# Patient Record
Sex: Female | Born: 2000 | Race: Black or African American | Hispanic: No | Marital: Single | Smoking: Never smoker
Health system: Southern US, Community
[De-identification: ages and names within clinical notes are randomized; demographics above are authoritative.]

## PROBLEM LIST (undated history)

## (undated) ENCOUNTER — Emergency Department (HOSPITAL_COMMUNITY): Admission: EM | Payer: Medicaid Other

## (undated) DIAGNOSIS — Z8619 Personal history of other infectious and parasitic diseases: Secondary | ICD-10-CM

## (undated) DIAGNOSIS — K819 Cholecystitis, unspecified: Secondary | ICD-10-CM

## (undated) DIAGNOSIS — K6289 Other specified diseases of anus and rectum: Secondary | ICD-10-CM

## (undated) DIAGNOSIS — R079 Chest pain, unspecified: Secondary | ICD-10-CM

## (undated) DIAGNOSIS — K5190 Ulcerative colitis, unspecified, without complications: Secondary | ICD-10-CM

## (undated) DIAGNOSIS — K529 Noninfective gastroenteritis and colitis, unspecified: Secondary | ICD-10-CM

## (undated) HISTORY — DX: Ulcerative colitis, unspecified, without complications: K51.90

## (undated) HISTORY — DX: Cholecystitis, unspecified: K81.9

## (undated) HISTORY — DX: Other specified diseases of anus and rectum: K62.89

## (undated) HISTORY — DX: Personal history of other infectious and parasitic diseases: Z86.19

## (undated) HISTORY — PX: EYE SURGERY: SHX253

## (undated) HISTORY — PX: TONSILECTOMY/ADENOIDECTOMY WITH MYRINGOTOMY: SHX6125

## (undated) HISTORY — DX: Chest pain, unspecified: R07.9

---
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
## 2018-04-20 NOTE — L&D Delivery Note (Signed)
OB/GYN Faculty Practice Delivery Note  Robin Garrison is a 18 y.o. G1P0 s/p NSVD at [redacted]w[redacted]d. She was admitted for spontaneous labor and Cat II tracing.   ROM: 0h 50m with clear fluid GBS Status: --/Positive (09/28 0217) Maximum Maternal Temperature: 99.6 F    Labor Progress: . Patient arrived at 2.5 cm dilation and progressed spontaneously to full dilation without further augmentation.   Delivery Date/Time: 02/07/2019 at 0356 Delivery: Called to room and patient was complete and pushing. Head delivered in LOA position. No nuchal cord present. Shoulder and body delivered in usual fashion. Infant with spontaneous cry, placed on mother's abdomen, dried and stimulated. Cord clamped x 2 after 1-minute delay, and cut by father. Cord blood drawn. Placenta delivered spontaneously with gentle cord traction. Fundus firm with massage and Pitocin. Labia, perineum, vagina, and cervix inspected with complex L labial laceration.   Placenta: 3v, intact Complications: none Lacerations: L labial, repaired with 3-0 Vicryl for deep sutures and 4-0 monocryl for subcuticular. Single pass of Vicryl passed through labial skin, patient made aware and advised to have segment cut in several days if persistently bothersome EBL: 358 cc Analgesia: epidural   Infant: APGAR (1 MIN): 9   APGAR (5 MINS): 9    Weight: 3246 grams  Augustin Coupe, MD/[facility] OB/GYN Fellow, Faculty Practice

## 2018-09-19 ENCOUNTER — Other Ambulatory Visit: Payer: Self-pay

## 2018-09-19 ENCOUNTER — Encounter: Payer: Self-pay | Admitting: Advanced Practice Midwife

## 2018-09-19 ENCOUNTER — Ambulatory Visit (INDEPENDENT_AMBULATORY_CARE_PROVIDER_SITE_OTHER): Payer: Medicaid Other | Admitting: Advanced Practice Midwife

## 2018-09-19 ENCOUNTER — Other Ambulatory Visit (HOSPITAL_COMMUNITY)
Admission: RE | Admit: 2018-09-19 | Discharge: 2018-09-19 | Disposition: A | Discharge status: Home / Self Care | Payer: Medicaid Other | Source: Ambulatory Visit | Attending: Advanced Practice Midwife | Admitting: Advanced Practice Midwife

## 2018-09-19 VITALS — BP 107/69 | HR 86 | Wt 166.0 lb

## 2018-09-19 DIAGNOSIS — Z3482 Encounter for supervision of other normal pregnancy, second trimester: Secondary | ICD-10-CM

## 2018-09-19 DIAGNOSIS — Z3A19 19 weeks gestation of pregnancy: Secondary | ICD-10-CM

## 2018-09-19 DIAGNOSIS — Z3480 Encounter for supervision of other normal pregnancy, unspecified trimester: Secondary | ICD-10-CM | POA: Diagnosis not present

## 2018-09-19 DIAGNOSIS — Z315 Encounter for genetic counseling: Secondary | ICD-10-CM | POA: Diagnosis not present

## 2018-09-19 MED ORDER — BLOOD PRESSURE CUFF MISC: 1.0000 | Freq: Once | 0 refills | Status: AC

## 2018-09-19 NOTE — Patient Instructions (Signed)

## 2018-09-19 NOTE — Progress Notes (Signed)
Pt is here for initial OB visit. LMP 05/03/18 (unsure of exact day) EDD 01/28/19 per patient.

## 2018-09-19 NOTE — Progress Notes (Signed)
Subjective:   Robin Garrison is a 18 y.o. G1P0 at 4249w6d by LMP being seen today for her first obstetrical visit.  Her obstetrical history is significant for none and has Ulcerative colitis without complications (HCC); Excessive gas; Lactose malabsorption; Chest pain on exertion; Dysmenorrhea in adolescent; Vitamin D deficiency; Chronic ulcerative proctitis ([facility]); and Supervision of other normal pregnancy, antepartum on their problem list.. Patient does intend to breast feed. Pregnancy history fully reviewed.  Patient reports no complaints.  Of note, pt reported to RN that [facility] with EDD 10/10, instead of 10/20 per LMP.  Pt indicated that [place] was done in [place], at [facility].  There is no record of an encounter or US during this pregnancy.    HISTORY: OB History  Gravida Para Term Preterm AB Living  1 0 0 0 0 0  SAB TAB Ectopic Multiple Live Births  0 0 0 0 0    # Outcome Date GA Lbr Len/2nd Weight Sex Delivery Anes PTL Lv  1 Current            Past Medical History:  Diagnosis Date  . Chest pain 03/06/2013   Saw [facility] cardiolgy for CP on exertion in cold weather; cleared for sports, no restrictions  . Cholecystitis   . Proctitis   . Proctitis    Past Surgical History:  Procedure Laterality Date  . TONSILECTOMY/ADENOIDECTOMY WITH MYRINGOTOMY     Family History  Problem Relation Age of Onset  . Diabetes Mother   . Diabetes Other   . Diabetes Paternal Aunt   . Vision loss Paternal Grandmother   . Inflammatory bowel disease Neg Hx    Social History   Tobacco Use  . Smoking status: Never Smoker  . Smokeless tobacco: Never Used  Substance Use Topics  . Alcohol use: No    Alcohol/week: 0.0 standard drinks  . Drug use: No   Allergies  Allergen Reactions  . Lactose Intolerance (Gi) Other (See Comments)    Upset stomach   Current Outpatient Medications on File Prior to Visit  Medication Sig Dispense Refill  . azithromycin (ZITHROMAX) 500 MG  tablet 1000 mg by mouth once (Patient not taking: Reported on 03/31/2017) 2 tablet 0  . folic acid (FOLVITE) 1 MG tablet   2  . mercaptopurine (PURINETHOL) 50 MG tablet   0  . naproxen (NAPROSYN) 500 MG tablet Take 1 tablet (500 mg total) by mouth 2 (two) times daily with a meal. (Patient not taking: Reported on 09/19/2018) 60 tablet 1  . norgestrel-ethinyl estradiol (LO/OVRAL,CRYSELLE) 0.3-30 MG-MCG tablet Take 1 tablet by mouth daily. 84 tablet 4  . sulfaSALAzine (AZULFIDINE) 500 MG tablet Take 2 tablets (1,000 mg total) by mouth 2 (two) times daily. (Patient not taking: Reported on 09/19/2018) 120 tablet 0   No current facility-administered medications on file prior to visit.      Exam   Vitals:   09/19/18 1351  BP: 107/69  Pulse: 86  Weight: 166 lb (75.3 kg)      Uterus:     Pelvic Exam: Perineum: no hemorrhoids, normal perineum   Vulva: normal external genitalia, no lesions   Vagina:  normal mucosa, normal discharge   Cervix: no lesions and normal, pap smear done.    Adnexa: normal adnexa and no mass, fullness, tenderness   Bony Pelvis: average  System: General: well-developed, well-nourished female in no acute distress   Breast:  normal appearance, no masses or tenderness  Skin: normal coloration and turgor, no rashes   Neurologic: oriented, normal, negative, normal mood   Extremities: normal strength, tone, and muscle mass, ROM of all joints is normal   HEENT PERRLA, extraocular movement intact and sclera clear, anicteric   Mouth/Teeth mucous membranes moist, pharynx normal without lesions and dental hygiene good   Neck supple and no masses   Cardiovascular: regular rate and rhythm   Respiratory:  no respiratory distress, normal breath sounds   Abdomen: soft, non-tender; bowel sounds normal; no masses,  no organomegaly     Assessment:   Pregnancy: G1P0 Patient Active Problem List   Diagnosis Date Noted  . Supervision of other normal pregnancy, antepartum 09/19/2018   . Vitamin D deficiency 12/28/2014  . 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 Dysmenorrhea in adolescent 10/03/2014  . Chest pain on exertion 03/06/2013  . 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 Excessive gas 11/24/2012  . Ulcerative colitis without complications (HCC)      Plan:   1. Supervision of other normal pregnancy, antepartum --Anticipatory guidance about next visits/weeks of pregnancy given. --Reviewed safety, visitor policy, reassurance about COVID-19 for pregnancy at this time. Discussed possible changes to visits, including televisits, that may occur due to COVID-19.  The office remains open if pt needs to be seen and MAU is open 24 hours/day for OB emergencies.   --Fundal height c/w LMP dating, no record of early [place] reported initially by pt, anatomy US ordered.  - Obstetric Panel, Including HIV - Culture, OB Urine - Genetic Screening - Babyscripts Schedule Optimization - AFP, Serum, Open Spina Bifida - Urine cytology ancillary only(Louise) - [place] MFM OB COMP + 14 WK; Future   Initial labs drawn. Continue prenatal vitamins. Discussed and offered genetic screening options, including Quad screen/AFP, NIPS testing, and option to decline testing. Benefits/risks/alternatives reviewed. Pt aware that anatomy US is form of genetic screening with lower accuracy in detecting trisomies than blood work.  Pt chooses genetic screening today. NIPS: ordered. Ultrasound discussed; fetal anatomic survey: ordered. Problem list reviewed and updated. The nature of [facility] with multiple MDs and other Advanced Practice Providers was explained to patient; also emphasized that residents, students are part of our team. Routine obstetric precautions reviewed. No follow-ups on file.   Sharen Counter, CNM 09/19/18 2:04 PM

## 2018-09-20 LAB — URINE CYTOLOGY ANCILLARY ONLY
Chlamydia: NEGATIVE
Neisseria Gonorrhea: NEGATIVE

## 2018-09-20 NOTE — Progress Notes (Signed)
Subjective: Robin Garrison is a G1P0 at [redacted]w[redacted]d who presents to [facility] today for ob/contraception couseling.  She does not  have a history of any mental health concerns. She is not  currently sexually active. [name]. Patient states immediate and extended family  as her support system.   BP 107/69   Pulse 86   Wt 166 lb (75.3 kg)   LMP 05/03/2018 (LMP Unknown)   BMI 29.41 kg/m   Birth Control History:  Pills   MDM Patient counseled on all options for birth control today including LARC. Patient desires pills initiated for birth control.  Assessment:  18 y.o. female considering pills for birth control  Plan: scheduled wic visit for patient with [facility]   Gwyndolyn Saxon, [place] 09/20/2018 11:51 AM

## 2018-09-21 LAB — AFP, SERUM, OPEN SPINA BIFIDA
AFP MoM: 1.24
AFP Value: 70 ng/mL
Gest. Age on Collection Date: 19.9 weeks
Maternal Age At EDD: 20.3 yr
OSBR Risk 1 IN: 10000
Test Results:: NEGATIVE
Weight: 166 [lb_av]

## 2018-09-21 LAB — OBSTETRIC PANEL, INCLUDING HIV
Antibody Screen: NEGATIVE
Basophils Absolute: 0 10*3/uL (ref 0.0–0.2)
Basos: 1 %
EOS (ABSOLUTE): 0.1 10*3/uL (ref 0.0–0.4)
Eos: 1 %
HIV Screen 4th Generation wRfx: NONREACTIVE
Hematocrit: 32.7 % — ABNORMAL LOW (ref 34.0–46.6)
Hemoglobin: 11.1 g/dL (ref 11.1–15.9)
Hepatitis B Surface Ag: NEGATIVE
Immature Grans (Abs): 0 10*3/uL (ref 0.0–0.1)
Immature Granulocytes: 0 %
Lymphocytes Absolute: 1.2 10*3/uL (ref 0.7–3.1)
Lymphs: 23 %
MCH: 31.8 pg (ref 26.6–33.0)
MCHC: 33.9 g/dL (ref 31.5–35.7)
MCV: 94 fL (ref 79–97)
Monocytes Absolute: 0.4 10*3/uL (ref 0.1–0.9)
Monocytes: 7 %
Neutrophils Absolute: 3.6 10*3/uL (ref 1.4–7.0)
Neutrophils: 68 %
Platelets: 283 10*3/uL (ref 150–450)
RBC: 3.49 x10E6/uL — ABNORMAL LOW (ref 3.77–5.28)
RDW: 11.9 % (ref 11.7–15.4)
RPR Ser Ql: NONREACTIVE
Rh Factor: POSITIVE
Rubella Antibodies, IGG: 1.71 index (ref >0.99)
WBC: 5.3 10*3/uL (ref 3.4–10.8)

## 2018-09-22 ENCOUNTER — Ambulatory Visit (HOSPITAL_COMMUNITY): Payer: Medicaid Other

## 2018-09-22 LAB — URINE CULTURE, OB REFLEX

## 2018-09-22 LAB — CULTURE, OB URINE

## 2018-09-26 ENCOUNTER — Encounter: Payer: Self-pay | Admitting: Advanced Practice Midwife

## 2018-09-27 ENCOUNTER — Other Ambulatory Visit: Payer: Self-pay

## 2018-09-27 ENCOUNTER — Ambulatory Visit (HOSPITAL_COMMUNITY)
Admission: RE | Admit: 2018-09-27 | Discharge: 2018-09-27 | Disposition: A | Discharge status: Home / Self Care | Payer: Medicaid Other | Source: Ambulatory Visit | Attending: Obstetrics and Gynecology | Admitting: Obstetrics and Gynecology

## 2018-09-27 DIAGNOSIS — Z3480 Encounter for supervision of other normal pregnancy, unspecified trimester: Secondary | ICD-10-CM | POA: Insufficient documentation

## 2018-09-27 DIAGNOSIS — Z3A19 19 weeks gestation of pregnancy: Secondary | ICD-10-CM | POA: Diagnosis not present

## 2018-09-27 DIAGNOSIS — Z363 Encounter for antenatal screening for malformations: Secondary | ICD-10-CM | POA: Diagnosis not present

## 2018-09-27 DIAGNOSIS — Z3A21 21 weeks gestation of pregnancy: Secondary | ICD-10-CM | POA: Diagnosis not present

## 2018-09-30 DIAGNOSIS — Z136 Encounter for screening for cardiovascular disorders: Secondary | ICD-10-CM | POA: Diagnosis not present

## 2018-09-30 DIAGNOSIS — Z3482 Encounter for supervision of other normal pregnancy, second trimester: Secondary | ICD-10-CM | POA: Diagnosis not present

## 2018-11-07 ENCOUNTER — Ambulatory Visit (INDEPENDENT_AMBULATORY_CARE_PROVIDER_SITE_OTHER): Payer: Medicaid Other | Admitting: Advanced Practice Midwife

## 2018-11-07 ENCOUNTER — Other Ambulatory Visit: Payer: Medicaid Other

## 2018-11-07 ENCOUNTER — Other Ambulatory Visit: Payer: Self-pay

## 2018-11-07 ENCOUNTER — Encounter: Payer: Self-pay | Admitting: Advanced Practice Midwife

## 2018-11-07 VITALS — BP 113/77 | HR 80 | Temp 98.0°F | Wt 178.0 lb

## 2018-11-07 DIAGNOSIS — Z3A26 26 weeks gestation of pregnancy: Secondary | ICD-10-CM

## 2018-11-07 DIAGNOSIS — Z23 Encounter for immunization: Secondary | ICD-10-CM

## 2018-11-07 DIAGNOSIS — Z3480 Encounter for supervision of other normal pregnancy, unspecified trimester: Secondary | ICD-10-CM | POA: Diagnosis not present

## 2018-11-07 DIAGNOSIS — O26892 Other specified pregnancy related conditions, second trimester: Secondary | ICD-10-CM

## 2018-11-07 DIAGNOSIS — R109 Unspecified abdominal pain: Secondary | ICD-10-CM

## 2018-11-07 NOTE — Progress Notes (Signed)
Pt notes sharp abdominal cramps that are everyday.

## 2018-11-07 NOTE — Patient Instructions (Signed)
Third Trimester of Pregnancy The third trimester is from week 28 through week 40 (months 7 through 9). The third trimester is a time when the unborn baby (fetus) is growing rapidly. At the end of the ninth month, the fetus is about 20 inches in length and weighs 6-10 pounds. Body changes during your third trimester Your body will continue to go through many changes during pregnancy. The changes vary from woman to woman. During the third trimester:  Your weight will continue to increase. You can expect to gain 25-35 pounds (11-16 kg) by the end of the pregnancy.  You may begin to get stretch marks on your hips, abdomen, and breasts.  You may urinate more often because the fetus is moving lower into your pelvis and pressing on your bladder.  You may develop or continue to have heartburn. This is caused by increased hormones that slow down muscles in the digestive tract.  You may develop or continue to have constipation because increased hormones slow digestion and cause the muscles that push waste through your intestines to relax.  You may develop hemorrhoids. These are swollen veins (varicose veins) in the rectum that can itch or be painful.  You may develop swollen, bulging veins (varicose veins) in your legs.  You may have increased body aches in the pelvis, back, or thighs. This is due to weight gain and increased hormones that are relaxing your joints.  You may have changes in your hair. These can include thickening of your hair, rapid growth, and changes in texture. Some women also have hair loss during or after pregnancy, or hair that feels dry or thin. Your hair will most likely return to normal after your baby is born.  Your breasts will continue to grow and they will continue to become tender. A yellow fluid (colostrum) may leak from your breasts. This is the first milk you are producing for your baby.  Your belly button may stick out.  You may notice more swelling in your hands,  face, or ankles.  You may have increased tingling or numbness in your hands, arms, and legs. The skin on your belly may also feel numb.  You may feel short of breath because of your expanding uterus.  You may have more problems sleeping. This can be caused by the size of your belly, increased need to urinate, and an increase in your body's metabolism.  You may notice the fetus "dropping," or moving lower in your abdomen (lightening).  You may have increased vaginal discharge.  You may notice your joints feel loose and you may have pain around your pelvic bone. What to expect at prenatal visits You will have prenatal exams every 2 weeks until week 36. Then you will have weekly prenatal exams. During a routine prenatal visit:  You will be weighed to make sure you and the baby are growing normally.  Your blood pressure will be taken.  Your abdomen will be measured to track your baby's growth.  The fetal heartbeat will be listened to.  Any test results from the previous visit will be discussed.  You may have a cervical check near your due date to see if your cervix has softened or thinned (effaced).  You will be tested for Group B streptococcus. This happens between 35 and 37 weeks. Your health care provider may ask you:  What your birth plan is.  How you are feeling.  If you are feeling the baby move.  If you have had any abnormal   symptoms, such as leaking fluid, bleeding, severe headaches, or abdominal cramping.  If you are using any tobacco products, including cigarettes, chewing tobacco, and electronic cigarettes.  If you have any questions. Other tests or screenings that may be performed during your third trimester include:  Blood tests that check for low iron levels (anemia).  Fetal testing to check the health, activity level, and growth of the fetus. Testing is done if you have certain medical conditions or if there are problems during the pregnancy.  Nonstress test  (NST). This test checks the health of your baby to make sure there are no signs of problems, such as the baby not getting enough oxygen. During this test, a belt is placed around your belly. The baby is made to move, and its heart rate is monitored during movement. What is false labor? False labor is a condition in which you feel small, irregular tightenings of the muscles in the womb (contractions) that usually go away with rest, changing position, or drinking water. These are called Braxton Hicks contractions. Contractions may last for hours, days, or even weeks before true labor sets in. If contractions come at regular intervals, become more frequent, increase in intensity, or become painful, you should see your health care provider. What are the signs of labor?  Abdominal cramps.  Regular contractions that start at 10 minutes apart and become stronger and more frequent with time.  Contractions that start on the top of the uterus and spread down to the lower abdomen and back.  Increased pelvic pressure and dull back pain.  A watery or bloody mucus discharge that comes from the vagina.  Leaking of amniotic fluid. This is also known as your "water breaking." It could be a slow trickle or a gush. Let your health care provider know if it has a color or strange odor. If you have any of these signs, call your health care provider right away, even if it is before your due date. Follow these instructions at home: Medicines  Follow your health care provider's instructions regarding medicine use. Specific medicines may be either safe or unsafe to take during pregnancy.  Take a prenatal vitamin that contains at least 600 micrograms (mcg) of folic acid.  If you develop constipation, try taking a stool softener if your health care provider approves. Eating and drinking   Eat a balanced diet that includes fresh fruits and vegetables, whole grains, good sources of protein such as meat, eggs, or tofu,  and low-fat dairy. Your health care provider will help you determine the amount of weight gain that is right for you.  Avoid raw meat and uncooked cheese. These carry germs that can cause birth defects in the baby.  If you have low calcium intake from food, talk to your health care provider about whether you should take a daily calcium supplement.  Eat four or five small meals rather than three large meals a day.  Limit foods that are high in fat and processed sugars, such as fried and sweet foods.  To prevent constipation: ? Drink enough fluid to keep your urine clear or pale yellow. ? Eat foods that are high in fiber, such as fresh fruits and vegetables, whole grains, and beans. Activity  Exercise only as directed by your health care provider. Most women can continue their usual exercise routine during pregnancy. Try to exercise for 30 minutes at least 5 days a week. Stop exercising if you experience uterine contractions.  Avoid heavy lifting.  Do   not exercise in extreme heat or humidity, or at high altitudes.  Wear low-heel, comfortable shoes.  Practice good posture.  You may continue to have sex unless your health care provider tells you otherwise. Relieving pain and discomfort  Take frequent breaks and rest with your legs elevated if you have leg cramps or low back pain.  Take warm sitz baths to soothe any pain or discomfort caused by hemorrhoids. Use hemorrhoid cream if your health care provider approves.  Wear a good support bra to prevent discomfort from breast tenderness.  If you develop varicose veins: ? Wear support pantyhose or compression stockings as told by your healthcare provider. ? Elevate your feet for 15 minutes, 3-4 times a day. Prenatal care  Write down your questions. Take them to your prenatal visits.  Keep all your prenatal visits as told by your health care provider. This is important. Safety  Wear your seat belt at all times when driving.  Make  a list of emergency phone numbers, including numbers for family, friends, the hospital, and police and fire departments. General instructions  Avoid cat litter boxes and soil used by cats. These carry germs that can cause birth defects in the baby. If you have a cat, ask someone to clean the litter box for you.  Do not travel far distances unless it is absolutely necessary and only with the approval of your health care provider.  Do not use hot tubs, steam rooms, or saunas.  Do not drink alcohol.  Do not use any products that contain nicotine or tobacco, such as cigarettes and e-cigarettes. If you need help quitting, ask your health care provider.  Do not use any medicinal herbs or unprescribed drugs. These chemicals affect the formation and growth of the baby.  Do not douche or use tampons or scented sanitary pads.  Do not cross your legs for long periods of time.  To prepare for the arrival of your baby: ? Take prenatal classes to understand, practice, and ask questions about labor and delivery. ? Make a trial run to the hospital. ? Visit the hospital and tour the maternity area. ? Arrange for maternity or paternity leave through employers. ? Arrange for family and friends to take care of pets while you are in the hospital. ? Purchase a rear-facing car seat and make sure you know how to install it in your car. ? Pack your hospital bag. ? Prepare the baby's nursery. Make sure to remove all pillows and stuffed animals from the baby's crib to prevent suffocation.  Visit your dentist if you have not gone during your pregnancy. Use a soft toothbrush to brush your teeth and be gentle when you floss. Contact a health care provider if:  You are unsure if you are in labor or if your water has broken.  You become dizzy.  You have mild pelvic cramps, pelvic pressure, or nagging pain in your abdominal area.  You have lower back pain.  You have persistent nausea, vomiting, or diarrhea.   You have an unusual or bad smelling vaginal discharge.  You have pain when you urinate. Get help right away if:  Your water breaks before 37 weeks.  You have regular contractions less than 5 minutes apart before 37 weeks.  You have a fever.  You are leaking fluid from your vagina.  You have spotting or bleeding from your vagina.  You have severe abdominal pain or cramping.  You have rapid weight loss or weight gain.  You have   shortness of breath with chest pain.  You notice sudden or extreme swelling of your face, hands, ankles, feet, or legs.  Your baby makes fewer than 10 movements in 2 hours.  You have severe headaches that do not go away when you take medicine.  You have vision changes. Summary  The third trimester is from week 28 through week 40, months 7 through 9. The third trimester is a time when the unborn baby (fetus) is growing rapidly.  During the third trimester, your discomfort may increase as you and your baby continue to gain weight. You may have abdominal, leg, and back pain, sleeping problems, and an increased need to urinate.  During the third trimester your breasts will keep growing and they will continue to become tender. A yellow fluid (colostrum) may leak from your breasts. This is the first milk you are producing for your baby.  False labor is a condition in which you feel small, irregular tightenings of the muscles in the womb (contractions) that eventually go away. These are called Braxton Hicks contractions. Contractions may last for hours, days, or even weeks before true labor sets in.  Signs of labor can include: abdominal cramps; regular contractions that start at 10 minutes apart and become stronger and more frequent with time; watery or bloody mucus discharge that comes from the vagina; increased pelvic pressure and dull back pain; and leaking of amniotic fluid. This information is not intended to replace advice given to you by your health  care provider. Make sure you discuss any questions you have with your health care provider. Document Released: 03/31/2001 Document Revised: 07/28/2018 Document Reviewed: 05/12/2016 Elsevier Patient Education  2020 [employer]  

## 2018-11-07 NOTE — Progress Notes (Signed)
   PRENATAL VISIT NOTE  Subjective:  Robin Garrison is a 18 y.o. G1P0 at [redacted]w[redacted]d being seen today for ongoing prenatal care.  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  Patient reports occasional contractions.  Contractions: Not present. Vag. Bleeding: None.  Movement: Present. Denies leaking of fluid.   The following portions of the patient's history were reviewed and updated as appropriate: allergies, current medications, past family history, past medical history, past social history, past surgical history and problem list.   Objective:   Vitals:   11/07/18 0931  BP: 113/77  Pulse: 80  Temp: 98 F (36.7 C)  Weight: 178 lb (80.7 kg)    Fetal Status:     Movement: Present     General:  Alert, oriented and cooperative. Patient is in no acute distress.  Skin: Skin is warm and dry. No rash noted.   Cardiovascular: Normal heart rate noted  Respiratory: Normal respiratory effort, no problems with respiration noted  Abdomen: Soft, gravid, appropriate for gestational age.  Pain/Pressure: Present     Pelvic: Cervical exam deferred        Extremities: Normal range of motion.  Edema: None  Mental Status: Normal mood and affect. Normal behavior. Normal judgment and thought content.   Assessment and Plan:  Pregnancy: G1P0 at [redacted]w[redacted]d   1. Supervision of other normal pregnancy, antepartum --Anticipatory guidance about next visits/weeks of pregnancy given.  --Reviewed safety, visitor policy, and COVID testing for scheduled IOL or and C/S and for active labor admission.  Reassurance about COVID-19 with young healthy women according to current data. Discussed possible changes to  visits, including televisits, that may occur due to COVID-19.  The office remains open if pt needs to be seen and MAU is open 24 hours/day for OB emergencies.  --Next visit in 4 weeks, virtual visit.  - Glucose Tolerance, 2 Hours w/1 Hour - CBC - RPR - HIV Antibody (routine testing w rflx)  2. Abdominal pain during pregnancy in second trimester --Irregular and infrequent, occurs at rest and when walking approximately 2-3 times daily.  Likely braxton-hicks contractions. Increase fluid intake, rest when needed.  Reviewed s/sx of PTL/reasons to seek care.  Preterm labor symptoms and general obstetric precautions including but not limited to vaginal bleeding, contractions, leaking of fluid and fetal movement were reviewed in detail with the patient. Please refer to After Visit Summary for other counseling recommendations.   Return in about 4 weeks (around 12/05/2018).  No future appointments.  Fatima Blank, CNM

## 2018-11-07 NOTE — Addendum Note (Signed)
Addended by: Courtney Heys on: 11/07/2018 10:28 AM   Modules accepted: Orders

## 2018-11-08 LAB — CBC
Hematocrit: 35.9 % (ref 34.0–46.6)
Hemoglobin: 11.7 g/dL (ref 11.1–15.9)
MCH: 30.5 pg (ref 26.6–33.0)
MCHC: 32.6 g/dL (ref 31.5–35.7)
MCV: 94 fL (ref 79–97)
Platelets: 259 x10E3/uL (ref 150–450)
RBC: 3.84 x10E6/uL (ref 3.77–5.28)
RDW: 12.4 % (ref 11.7–15.4)
WBC: 4.9 x10E3/uL (ref 3.4–10.8)

## 2018-11-08 LAB — GLUCOSE TOLERANCE, 2 HOURS W/ 1HR
Glucose, 1 hour: 103 mg/dL (ref 65–179)
Glucose, 2 hour: 96 mg/dL (ref 65–152)
Glucose, Fasting: 76 mg/dL (ref 65–91)

## 2018-11-08 LAB — RPR: RPR Ser Ql: NONREACTIVE

## 2018-11-08 LAB — HIV ANTIBODY (ROUTINE TESTING W REFLEX): HIV Screen 4th Generation wRfx: NONREACTIVE

## 2018-12-05 ENCOUNTER — Ambulatory Visit (INDEPENDENT_AMBULATORY_CARE_PROVIDER_SITE_OTHER): Payer: Medicaid Other | Admitting: Obstetrics

## 2018-12-05 ENCOUNTER — Encounter: Payer: Self-pay | Admitting: Obstetrics

## 2018-12-05 ENCOUNTER — Encounter: Payer: Medicaid Other | Admitting: Obstetrics

## 2018-12-05 VITALS — BP 117/78 | HR 99

## 2018-12-05 DIAGNOSIS — M549 Dorsalgia, unspecified: Secondary | ICD-10-CM | POA: Diagnosis not present

## 2018-12-05 DIAGNOSIS — Z3A30 30 weeks gestation of pregnancy: Secondary | ICD-10-CM

## 2018-12-05 DIAGNOSIS — O26893 Other specified pregnancy related conditions, third trimester: Secondary | ICD-10-CM

## 2018-12-05 DIAGNOSIS — K5120 Ulcerative (chronic) proctitis without complications: Secondary | ICD-10-CM

## 2018-12-05 DIAGNOSIS — Z3480 Encounter for supervision of other normal pregnancy, unspecified trimester: Secondary | ICD-10-CM

## 2018-12-05 MED ORDER — COMFORT FIT MATERNITY SUPP SM MISC: 0 refills | Status: DC

## 2018-12-05 NOTE — Addendum Note (Signed)
Addended by: Shelly Bombard on: 12/05/2018 04:18 PM   Modules accepted: Orders

## 2018-12-05 NOTE — Progress Notes (Addendum)
   TELEHEALTH VIRTUAL OBSTETRICS VISIT ENCOUNTER NOTE  I connected with Robin I Blinder on 12/05/18 at  3:15 PM EDT by telephone at home and verified that I am speaking with the correct person using two identifiers.   I discussed the limitations, risks, security and privacy concerns of performing an evaluation and management service by telephone and the availability of in person appointments. I also discussed with the patient that there may be a patient responsible charge related to this service. The patient expressed understanding and agreed to proceed.  Subjective:  Robin Garrison is a 18 y.o. G1P0 at 89w6dbeing followed for ongoing prenatal care.  She is currently monitored for the following issues for this low-risk pregnancy and has Ulcerative colitis without complications ([place]; Excessive gas; Lactose malabsorption; Chest pain on exertion; Dysmenorrhea in adolescent; Vitamin D deficiency; [place] ulcerative [place]ctitis ([place]; and Supervision of other normal pregnancy, antepartum on their problem list.  Patient reports backache. Reports fetal movement. Denies any contractions, bleeding or leaking of fluid.   The following portions of the patient's history were reviewed and updated as appropriate: allergies, current medications, past family history, past medical history, past social history, past surgical history and problem list.   Objective:   General:  Alert, oriented and cooperative.   Mental Status: Normal mood and affect perceived. Normal judgment and thought content.  Rest of physical exam deferred due to type of encounter  Assessment and Plan:  Pregnancy: G1P0 at 39w6d1. Supervision of other normal pregnancy, antepartum  2. [place] without complication [id number]. Backache symptom Rx: - Elastic Bandages & Supports (COMFORT FIT MATERNITY SUPP SM) MISC; Wear as directed.  Dispense: 1 each; Refill: 0  Preterm labor symptoms and  general obstetric precautions including but not limited to vaginal bleeding, contractions, leaking of fluid and fetal movement were reviewed in detail with the patient.  I discussed the assessment and treatment plan with the patient. The patient was provided an opportunity to ask questions and all were answered. The patient agreed with the plan and demonstrated an understanding of the instructions. The patient was advised to call back or seek an in-person office evaluation/[facility] any urgent or concerning symptoms. Please refer to After Visit Summary for other counseling recommendations.   I provided 10 minutes of non-face-to-face time during this encounter.  Return in about 2 weeks (around 12/19/2018) for MyChart.   ChBaltazar NajjarMD [facility] 12/05/2018

## 2018-12-08 ENCOUNTER — Other Ambulatory Visit: Payer: Self-pay

## 2018-12-08 ENCOUNTER — Encounter (HOSPITAL_COMMUNITY): Payer: Self-pay | Admitting: *Deleted

## 2018-12-08 ENCOUNTER — Inpatient Hospital Stay (HOSPITAL_COMMUNITY)
Admission: AD | Admit: 2018-12-08 | Discharge: 2018-12-08 | Disposition: A | Discharge status: Home / Self Care | Payer: Medicaid Other | Attending: Obstetrics and Gynecology | Admitting: Obstetrics and Gynecology

## 2018-12-08 DIAGNOSIS — M545 Low back pain: Secondary | ICD-10-CM | POA: Insufficient documentation

## 2018-12-08 DIAGNOSIS — Z3480 Encounter for supervision of other normal pregnancy, unspecified trimester: Secondary | ICD-10-CM

## 2018-12-08 DIAGNOSIS — R109 Unspecified abdominal pain: Secondary | ICD-10-CM | POA: Insufficient documentation

## 2018-12-08 DIAGNOSIS — O368130 Decreased fetal movements, third trimester, not applicable or unspecified: Secondary | ICD-10-CM | POA: Diagnosis not present

## 2018-12-08 DIAGNOSIS — Z3689 Encounter for other specified antenatal screening: Secondary | ICD-10-CM | POA: Diagnosis not present

## 2018-12-08 DIAGNOSIS — Z3A31 31 weeks gestation of pregnancy: Secondary | ICD-10-CM | POA: Diagnosis not present

## 2018-12-08 DIAGNOSIS — O26893 Other specified pregnancy related conditions, third trimester: Secondary | ICD-10-CM | POA: Diagnosis not present

## 2018-12-08 DIAGNOSIS — O339 Maternal care for disproportion, unspecified: Secondary | ICD-10-CM | POA: Diagnosis not present

## 2018-12-08 HISTORY — DX: Noninfective gastroenteritis and colitis, unspecified: K52.9

## 2018-12-08 LAB — URINALYSIS, ROUTINE W REFLEX MICROSCOPIC
Bilirubin Urine: NEGATIVE
Glucose, UA: NEGATIVE mg/dL
Hgb urine dipstick: NEGATIVE
Ketones, ur: NEGATIVE mg/dL
Leukocytes,Ua: NEGATIVE
Nitrite: NEGATIVE
Protein, ur: NEGATIVE mg/dL
Specific Gravity, Urine: 1.014 (ref 1.005–1.030)
pH: 7 (ref 5.0–8.0)

## 2018-12-08 NOTE — MAU Provider Note (Signed)
History     CSN: [id number]  Arrival date and time: 12/08/18 1057   First Provider Initiated Contact with Patient 12/08/18 1200      Chief Complaint  Patient presents with  . Decreased Fetal Movement   Robin Garrison is a 18 y.o. G1P0 at [redacted]w[redacted]d who receives care at [facility].  She presents today for Decreased Fetal Movement.  She states she last felt movement yesterday night.  She reports that she usually feels movement throughout the night.  She states she had a sausage, egg, and cheese biscuit this morning around 10am and has still not detected movement.  Patient denies contractions and vaginal concerns including discharge, leaking, or bleeding.      OB History    Gravida  1   Para      Term      Preterm      AB      Living        SAB      TAB      Ectopic      Multiple      Live Births              Past Medical History:  Diagnosis Date  . Chest pain 03/06/2013   Saw [facility] cardiolgy for CP on exertion in cold weather; cleared for sports, no restrictions  . Cholecystitis   . Colitis   . Proctitis   . Proctitis     Past Surgical History:  Procedure Laterality Date  . TONSILECTOMY/ADENOIDECTOMY WITH MYRINGOTOMY      Family History  Problem Relation Age of Onset  . Hypertension Mother   . Healthy Father   . Diabetes Other   . Diabetes Paternal Aunt   . Vision loss Paternal Grandmother   . Inflammatory bowel disease Neg Hx     Social History   Tobacco Use  . Smoking status: Never Smoker  . Smokeless tobacco: Never Used  Substance Use Topics  . Alcohol use: No    Alcohol/week: 0.0 standard drinks  . Drug use: No    Allergies:  Allergies  Allergen Reactions  . Lactose Intolerance ([place]) Other (See Comments)    Upset stomach    Medications Prior to Admission  Medication Sig Dispense Refill Last Dose  . Elastic Bandages & Supports (COMFORT FIT MATERNITY SUPP SM) MISC Wear as directed. 1 each 0   . folic acid (FOLVITE) 1 MG tablet    2   . mercaptopurine (PURINETHOL) 50 MG tablet   0   . naproxen (NAPROSYN) 500 MG tablet Take 1 tablet (500 mg total) by mouth 2 (two) times daily with a meal. (Patient not taking: Reported on 09/19/2018) 60 tablet 1   . Prenatal Vit-Fe Fumarate-FA (PRENATAL MULTIVITAMIN) TABS tablet Take 1 tablet by mouth daily at 12 noon.     . sulfaSALAzine (AZULFIDINE) 500 MG tablet Take 2 tablets (1,000 mg total) by mouth 2 (two) times daily. (Patient not taking: Reported on 09/19/2018) 120 tablet 0     Review of Systems  Constitutional: Negative for chills and fever.  Respiratory: Negative for cough and shortness of breath.   Gastrointestinal: Negative for constipation, diarrhea, nausea and vomiting.  Genitourinary: Negative for dyspareunia, dysuria, vaginal bleeding and vaginal discharge.  Neurological: Negative for dizziness, light-headedness and headaches.   Physical Exam   Blood pressure 108/68, pulse (!) 112, temperature 98.8 F (37.1 C), temperature source Oral, resp. rate 16, last menstrual period 05/03/2018, SpO2 98 %.  Physical Exam  Constitutional: She is oriented to person, place, and time. She appears well-developed and well-nourished. No distress.  HENT:  Head: Normocephalic and atraumatic.  Eyes: Conjunctivae are normal.  Neck: Normal range of motion.  Cardiovascular: Normal rate.  Respiratory: Effort normal.  GI: Soft. There is no abdominal tenderness.  Musculoskeletal: Normal range of motion.  Neurological: She is alert and oriented to person, place, and time.  Skin: Skin is warm and dry.  Psychiatric: She has a normal mood and affect. Her behavior is normal.    Fetal Assessment 145 bpm, Mod Var, -Decels, +Accels Toco: None graphed  MAU Course   Results for orders placed or performed during the hospital encounter of 12/08/18 (from the past 24 hour(s))  Urinalysis, Routine w reflex microscopic     Status: Abnormal   Collection Time: 12/08/18 11:26 AM  Result Value Ref Range    Color, Urine YELLOW YELLOW   APPearance HAZY (A) CLEAR   Specific Gravity, Urine 1.014 1.005 - 1.030   pH 7.0 5.0 - 8.0   Glucose, UA NEGATIVE NEGATIVE mg/dL   Hgb urine dipstick NEGATIVE NEGATIVE   Bilirubin Urine NEGATIVE NEGATIVE   Ketones, ur NEGATIVE NEGATIVE mg/dL   Protein, ur NEGATIVE NEGATIVE mg/dL   Nitrite NEGATIVE NEGATIVE   Leukocytes,Ua NEGATIVE NEGATIVE   No results found.  MDM PE Labs:UA EFM  Assessment and Plan  18 year old G1P0  SIUP at 31.2 weeks Cat I FT DFM  -Exam findings discussed. -Informed of reactive NST. -Patient placed on left side and will give juice to illicit fetal movement. -TV turned off and phone given to mother.  -Patient encouraged to detect fetal movement.  -Will return to reassess.   Cherre RobinsJessica L Pepper Kerrick MSN, CNM 12/08/2018, 12:00 PM   Reassessment (1:13 PM) +Fetal Movement   -Patient reports perception of 12 movements. -Encouraged to try these techniques at home and contact office or report to MAU if unsuccessful. -Patient verbalized understanding and has no questions or concerns. -Discharged to home in stable condition.  Cherre RobinsJessica L Kohei Antonellis MSN, CNM 12/08/2018 1:15 PM

## 2018-12-08 NOTE — Discharge Instructions (Signed)
Third Trimester of Pregnancy The third trimester is from week 28 through week 40 (months 7 through 9). The third trimester is a time when the unborn baby (fetus) is growing rapidly. At the end of the ninth month, the fetus is about 20 inches in length and weighs 6-10 pounds. Body changes during your third trimester Your body will continue to go through many changes during pregnancy. The changes vary from woman to woman. During the third trimester:  Your weight will continue to increase. You can expect to gain 25-35 pounds (11-16 kg) by the end of the pregnancy.  You may begin to get stretch marks on your hips, abdomen, and breasts.  You may urinate more often because the fetus is moving lower into your pelvis and pressing on your bladder.  You may develop or continue to have heartburn. This is caused by increased hormones that slow down muscles in the digestive tract.  You may develop or continue to have constipation because increased hormones slow digestion and cause the muscles that push waste through your intestines to relax.  You may develop hemorrhoids. These are swollen veins (varicose veins) in the rectum that can itch or be painful.  You may develop swollen, bulging veins (varicose veins) in your legs.  You may have increased body aches in the pelvis, back, or thighs. This is due to weight gain and increased hormones that are relaxing your joints.  You may have changes in your hair. These can include thickening of your hair, rapid growth, and changes in texture. Some women also have hair loss during or after pregnancy, or hair that feels dry or thin. Your hair will most likely return to normal after your baby is born.  Your breasts will continue to grow and they will continue to become tender. A yellow fluid (colostrum) may leak from your breasts. This is the first milk you are producing for your baby.  Your belly button may stick out.  You may notice more swelling in your hands,  face, or ankles.  You may have increased tingling or numbness in your hands, arms, and legs. The skin on your belly may also feel numb.  You may feel short of breath because of your expanding uterus.  You may have more problems sleeping. This can be caused by the size of your belly, increased need to urinate, and an increase in your body's metabolism.  You may notice the fetus "dropping," or moving lower in your abdomen (lightening).  You may have increased vaginal discharge.  You may notice your joints feel loose and you may have pain around your pelvic bone. What to expect at prenatal visits You will have prenatal exams every 2 weeks until week 36. Then you will have weekly prenatal exams. During a routine prenatal visit:  You will be weighed to make sure you and the baby are growing normally.  Your blood pressure will be taken.  Your abdomen will be measured to track your baby's growth.  The fetal heartbeat will be listened to.  Any test results from the previous visit will be discussed.  You may have a cervical check near your due date to see if your cervix has softened or thinned (effaced).  You will be tested for Group B streptococcus. This happens between 35 and 37 weeks. Your health care provider may ask you:  What your birth plan is.  How you are feeling.  If you are feeling the baby move.  If you have had any abnormal   symptoms, such as leaking fluid, bleeding, severe headaches, or abdominal cramping.  If you are using any tobacco products, including cigarettes, chewing tobacco, and electronic cigarettes.  If you have any questions. Other tests or screenings that may be performed during your third trimester include:  Blood tests that check for low iron levels (anemia).  Fetal testing to check the health, activity level, and growth of the fetus. Testing is done if you have certain medical conditions or if there are problems during the pregnancy.  Nonstress test  (NST). This test checks the health of your baby to make sure there are no signs of problems, such as the baby not getting enough oxygen. During this test, a belt is placed around your belly. The baby is made to move, and its heart rate is monitored during movement. What is false labor? False labor is a condition in which you feel small, irregular tightenings of the muscles in the womb (contractions) that usually go away with rest, changing position, or drinking water. These are called Braxton Hicks contractions. Contractions may last for hours, days, or even weeks before true labor sets in. If contractions come at regular intervals, become more frequent, increase in intensity, or become painful, you should see your health care provider. What are the signs of labor?  Abdominal cramps.  Regular contractions that start at 10 minutes apart and become stronger and more frequent with time.  Contractions that start on the top of the uterus and spread down to the lower abdomen and back.  Increased pelvic pressure and dull back pain.  A watery or bloody mucus discharge that comes from the vagina.  Leaking of amniotic fluid. This is also known as your "water breaking." It could be a slow trickle or a gush. Let your health care provider know if it has a color or strange odor. If you have any of these signs, call your health care provider right away, even if it is before your due date. Follow these instructions at home: Medicines  Follow your health care provider's instructions regarding medicine use. Specific medicines may be either safe or unsafe to take during pregnancy.  Take a prenatal vitamin that contains at least 600 micrograms (mcg) of folic acid.  If you develop constipation, try taking a stool softener if your health care provider approves. Eating and drinking   Eat a balanced diet that includes fresh fruits and vegetables, whole grains, good sources of protein such as meat, eggs, or tofu,  and low-fat dairy. Your health care provider will help you determine the amount of weight gain that is right for you.  Avoid raw meat and uncooked cheese. These carry germs that can cause birth defects in the baby.  If you have low calcium intake from food, talk to your health care provider about whether you should take a daily calcium supplement.  Eat four or five small meals rather than three large meals a day.  Limit foods that are high in fat and processed sugars, such as fried and sweet foods.  To prevent constipation: ? Drink enough fluid to keep your urine clear or pale yellow. ? Eat foods that are high in fiber, such as fresh fruits and vegetables, whole grains, and beans. Activity  Exercise only as directed by your health care provider. Most women can continue their usual exercise routine during pregnancy. Try to exercise for 30 minutes at least 5 days a week. Stop exercising if you experience uterine contractions.  Avoid heavy lifting.  Do   not exercise in extreme heat or humidity, or at high altitudes.  Wear low-heel, comfortable shoes.  Practice good posture.  You may continue to have sex unless your health care provider tells you otherwise. Relieving pain and discomfort  Take frequent breaks and rest with your legs elevated if you have leg cramps or low back pain.  Take warm sitz baths to soothe any pain or discomfort caused by hemorrhoids. Use hemorrhoid cream if your health care provider approves.  Wear a good support bra to prevent discomfort from breast tenderness.  If you develop varicose veins: ? Wear support pantyhose or compression stockings as told by your healthcare provider. ? Elevate your feet for 15 minutes, 3-4 times a day. Prenatal care  Write down your questions. Take them to your prenatal visits.  Keep all your prenatal visits as told by your health care provider. This is important. Safety  Wear your seat belt at all times when driving.  Make  a list of emergency phone numbers, including numbers for family, friends, the hospital, and police and fire departments. General instructions  Avoid cat litter boxes and soil used by cats. These carry germs that can cause birth defects in the baby. If you have a cat, ask someone to clean the litter box for you.  Do not travel far distances unless it is absolutely necessary and only with the approval of your health care provider.  Do not use hot tubs, steam rooms, or saunas.  Do not drink alcohol.  Do not use any products that contain nicotine or tobacco, such as cigarettes and e-cigarettes. If you need help quitting, ask your health care provider.  Do not use any medicinal herbs or unprescribed drugs. These chemicals affect the formation and growth of the baby.  Do not douche or use tampons or scented sanitary pads.  Do not cross your legs for long periods of time.  To prepare for the arrival of your baby: ? Take prenatal classes to understand, practice, and ask questions about labor and delivery. ? Make a trial run to the hospital. ? Visit the hospital and tour the maternity area. ? Arrange for maternity or paternity leave through employers. ? Arrange for family and friends to take care of pets while you are in the hospital. ? Purchase a rear-facing car seat and make sure you know how to install it in your car. ? Pack your hospital bag. ? Prepare the baby's nursery. Make sure to remove all pillows and stuffed animals from the baby's crib to prevent suffocation.  Visit your dentist if you have not gone during your pregnancy. Use a soft toothbrush to brush your teeth and be gentle when you floss. Contact a health care provider if:  You are unsure if you are in labor or if your water has broken.  You become dizzy.  You have mild pelvic cramps, pelvic pressure, or nagging pain in your abdominal area.  You have lower back pain.  You have persistent nausea, vomiting, or diarrhea.   You have an unusual or bad smelling vaginal discharge.  You have pain when you urinate. Get help right away if:  Your water breaks before 37 weeks.  You have regular contractions less than 5 minutes apart before 37 weeks.  You have a fever.  You are leaking fluid from your vagina.  You have spotting or bleeding from your vagina.  You have severe abdominal pain or cramping.  You have rapid weight loss or weight gain.  You have   shortness of breath with chest pain.  You notice sudden or extreme swelling of your face, hands, ankles, feet, or legs.  Your baby makes fewer than 10 movements in 2 hours.  You have severe headaches that do not go away when you take medicine.  You have vision changes. Summary  The third trimester is from week 28 through week 40, months 7 through 9. The third trimester is a time when the unborn baby (fetus) is growing rapidly.  During the third trimester, your discomfort may increase as you and your baby continue to gain weight. You may have abdominal, leg, and back pain, sleeping problems, and an increased need to urinate.  During the third trimester your breasts will keep growing and they will continue to become tender. A yellow fluid (colostrum) may leak from your breasts. This is the first milk you are producing for your baby.  False labor is a condition in which you feel small, irregular tightenings of the muscles in the womb (contractions) that eventually go away. These are called Braxton Hicks contractions. Contractions may last for hours, days, or even weeks before true labor sets in.  Signs of labor can include: abdominal cramps; regular contractions that start at 10 minutes apart and become stronger and more frequent with time; watery or bloody mucus discharge that comes from the vagina; increased pelvic pressure and dull back pain; and leaking of amniotic fluid. This information is not intended to replace advice given to you by your health  care provider. Make sure you discuss any questions you have with your health care provider. Document Released: 03/31/2001 Document Revised: 07/28/2018 Document Reviewed: 05/12/2016 Elsevier Patient Education  2020 [employer]  

## 2018-12-08 NOTE — MAU Note (Signed)
Pain is usually when getting up or when she has been standing or walking for awhile.  Has not had any this morning

## 2018-12-08 NOTE — MAU Note (Signed)
Decreased fetal movement, last few days.  One day, she didn't move at all. Moving now, just less.  Called dr, told to come in. Denies bleeding or leaking. Some abd cramping and low back pain.

## 2018-12-19 ENCOUNTER — Encounter: Payer: Self-pay | Admitting: Obstetrics

## 2018-12-19 ENCOUNTER — Other Ambulatory Visit: Payer: Self-pay

## 2018-12-19 ENCOUNTER — Ambulatory Visit (INDEPENDENT_AMBULATORY_CARE_PROVIDER_SITE_OTHER): Payer: Medicaid Other | Admitting: Obstetrics

## 2018-12-19 DIAGNOSIS — Z3480 Encounter for supervision of other normal pregnancy, unspecified trimester: Secondary | ICD-10-CM

## 2018-12-19 DIAGNOSIS — Z3483 Encounter for supervision of other normal pregnancy, third trimester: Secondary | ICD-10-CM

## 2018-12-19 DIAGNOSIS — Z3A32 32 weeks gestation of pregnancy: Secondary | ICD-10-CM

## 2018-12-19 NOTE — Progress Notes (Signed)
   TELEHEALTH VIRTUAL OBSTETRICS VISIT ENCOUNTER NOTE  I connected with Robin Garrison on 12/19/18 at  3:15 PM EDT by telephone at home and verified that I am speaking with the correct person using two identifiers.   I discussed the limitations, risks, security and privacy concerns of performing an evaluation and management service by telephone and the availability of in person appointments. I also discussed with the patient that there may be a patient responsible charge related to this service. The patient expressed understanding and agreed to proceed.  Subjective:  Robin Garrison is a 18 y.o. G1P0 at [redacted]w[redacted]d being followed for ongoing prenatal care.  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  Patient reports backache. Reports fetal movement. Denies any contractions, bleeding or leaking of fluid.   The following portions of the patient's history were reviewed and updated as appropriate: allergies, current medications, past family history, past medical history, past social history, past surgical history and problem list.   Objective:   General:  Alert, oriented and cooperative.   Mental Status: Normal mood and affect perceived. Normal judgment and thought content.  Rest of physical exam deferred due to type of encounter  Assessment and Plan:  Pregnancy: G1P0 at [redacted]w[redacted]d 1. Supervision of other normal pregnancy, antepartum   Preterm labor symptoms and general obstetric precautions including but not limited to vaginal bleeding, contractions, leaking of fluid and fetal movement were reviewed in detail with the patient.  I discussed the assessment and treatment plan with the patient.  The patient was provided an opportunity to ask questions and all were answered. The patient agreed with the plan and demonstrated an understanding of the instructions. The patient was advised to call back or seek an in-person office evaluation/go to [facility]U at [facility] for any urgent or concerning symptoms. Please refer to After Visit Summary for other counseling recommendations.   I provided 10 minutes of non-face-to-face time during this encounter.  Return in about 2 weeks (around 01/02/2019) for [place], [place] for [facility] 12/19/2018

## 2018-12-19 NOTE — Progress Notes (Signed)
I connected with  [place] I Glennon Mac on 12/19/18 by a video enabled telemedicine application and verified that I am speaking with the correct person using two identifiers.   ROB.  C/o pain and pressure.  She is unable to check BP now because is not at home.

## 2019-01-02 ENCOUNTER — Telehealth (INDEPENDENT_AMBULATORY_CARE_PROVIDER_SITE_OTHER): Payer: Medicaid Other | Admitting: Obstetrics

## 2019-01-02 ENCOUNTER — Encounter: Payer: Self-pay | Admitting: Obstetrics

## 2019-01-02 DIAGNOSIS — Z3483 Encounter for supervision of other normal pregnancy, third trimester: Secondary | ICD-10-CM | POA: Diagnosis not present

## 2019-01-02 DIAGNOSIS — K5120 Ulcerative (chronic) proctitis without complications: Secondary | ICD-10-CM

## 2019-01-02 DIAGNOSIS — Z3480 Encounter for supervision of other normal pregnancy, unspecified trimester: Secondary | ICD-10-CM

## 2019-01-02 DIAGNOSIS — Z3A34 34 weeks gestation of pregnancy: Secondary | ICD-10-CM

## 2019-01-02 NOTE — Progress Notes (Signed)
   TELEHEALTH VIRTUAL OBSTETRICS VISIT ENCOUNTER NOTE  I connected with Robin Garrison on 01/02/19 at  2:45 PM EDT by telephone at home and verified that I am speaking with the correct person using two identifiers.   I discussed the limitations, risks, security and privacy concerns of performing an evaluation and management service by telephone and the availability of in person appointments. I also discussed with the patient that there may be a patient responsible charge related to this service. The patient expressed understanding and agreed to proceed.  Subjective:  Robin Garrison is a 18 y.o. G1P0 at 48w6dbeing followed for ongoing prenatal care.  She is currently monitored for the following issues for this low-risk pregnancy and has Ulcerative colitis without complications ([place]; Excessive gas; Lactose malabsorption; Chest pain on exertion; Dysmenorrhea in adolescent; Vitamin D deficiency; [place]ative [place]itis ([place]; and Supervision of other normal pregnancy, antepartum on their problem list.  Patient reports no complaints. Reports fetal movement. Denies any contractions, bleeding or leaking of fluid.   The following portions of the patient's history were reviewed and updated as appropriate: allergies, current medications, past family history, past medical history, past social history, past surgical history and problem list.   Objective:   General:  Alert, oriented and cooperative.   Mental Status: Normal mood and affect perceived. Normal judgment and thought content.  Rest of physical exam deferred due to type of encounter  Assessment and Plan:  Pregnancy: G1P0 at 35w6d1. Supervision of other normal pregnancy, antepartum  2. Ulcerative proctitis without complication ([facility]) - clinically stable   Preterm labor symptoms and general obstetric precautions including but not limited to vaginal bleeding, contractions, leaking of fluid and fetal movement were reviewed in detail  with the patient.  I discussed the assessment and treatment plan with the patient. The patient was provided an opportunity to ask questions and all were answered. The patient agreed with the plan and demonstrated an understanding of the instructions. The patient was advised to call back or seek an in-person office evaluation/[facility]. Please refer to After Visit Summary for other counseling recommendations.   I provided 10 minutes of non-face-to-face time during this encounter.  Return in about 2 weeks (around 01/16/2019) for ROB.  GBS.. Marland Kitchen ChBaltazar NajjarMD [facility] 01/02/2019

## 2019-01-02 NOTE — Progress Notes (Signed)
S/w pt for mychart visit, pt reports fetal movement and irregular contractions. Pt is not at home to take BP, and states that she is having phone issues and would like to do a tele visit.

## 2019-01-16 ENCOUNTER — Other Ambulatory Visit (HOSPITAL_COMMUNITY)
Admission: RE | Admit: 2019-01-16 | Discharge: 2019-01-16 | Disposition: A | Discharge status: Home / Self Care | Payer: Medicaid Other | Source: Ambulatory Visit | Attending: Advanced Practice Midwife | Admitting: Advanced Practice Midwife

## 2019-01-16 ENCOUNTER — Other Ambulatory Visit: Payer: Self-pay

## 2019-01-16 ENCOUNTER — Ambulatory Visit (INDEPENDENT_AMBULATORY_CARE_PROVIDER_SITE_OTHER): Payer: Medicaid Other | Admitting: Advanced Practice Midwife

## 2019-01-16 DIAGNOSIS — Z3A36 36 weeks gestation of pregnancy: Secondary | ICD-10-CM

## 2019-01-16 DIAGNOSIS — Z23 Encounter for immunization: Secondary | ICD-10-CM | POA: Diagnosis not present

## 2019-01-16 DIAGNOSIS — Z3480 Encounter for supervision of other normal pregnancy, unspecified trimester: Secondary | ICD-10-CM | POA: Insufficient documentation

## 2019-01-16 DIAGNOSIS — Z3483 Encounter for supervision of other normal pregnancy, third trimester: Secondary | ICD-10-CM

## 2019-01-16 NOTE — Progress Notes (Signed)
   PRENATAL VISIT NOTE  Subjective:  Robin Garrison is a 18 y.o. G1P0 at [redacted]w[redacted]d being seen today for ongoing prenatal care.  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  Patient reports occasional contractions.  Contractions: Irritability. Vag. Bleeding: None.  Movement: Present. Denies leaking of fluid.   The following portions of the patient's history were reviewed and updated as appropriate: allergies, current medications, past family history, past medical history, past social history, past surgical history and problem list.   Objective:   Vitals:   01/16/19 1359  BP: 119/79  Pulse: 96  Weight: 199 lb (90.3 kg)    Fetal Status: Fetal Heart Rate (bpm): 145 Fundal Height: 37 cm Movement: Present  Presentation: Vertex  General:  Alert, oriented and cooperative. Patient is in no acute distress.  Skin: Skin is warm and dry. No rash noted.   Cardiovascular: Normal heart rate noted  Respiratory: Normal respiratory effort, no problems with respiration noted  Abdomen: Soft, gravid, appropriate for gestational age.  Pain/Pressure: Present     Pelvic: Cervical exam performed Dilation: Closed Effacement (%): 50 Station: -2  Extremities: Normal range of motion.     Mental Status: Normal mood and affect. Normal behavior. Normal judgment and thought content.   Assessment and Plan:  Pregnancy: G1P0 at [redacted]w[redacted]d 1. Supervision of other normal pregnancy, antepartum --Anticipatory guidance about next visits/weeks of pregnancy given. --Vertex position noted with [place], Cervix 0/50/-2, soft - Strep Gp B NAA - Cervicovaginal ancillary only( [place])  Term labor symptoms and general  obstetric precautions including but not limited to vaginal bleeding, contractions, leaking of fluid and fetal movement were reviewed in detail with the patient. Please refer to After Visit Summary for other counseling recommendations.   Return in about 8 days (around 01/24/2019).  No future appointments.  Fatima Blank, CNM

## 2019-01-16 NOTE — Patient Instructions (Signed)
Labor Precautions Reasons to come to MAU at [facility]:  1.  Contractions are  5 minutes apart or less, each last 1 minute, these have been going on for 1-2 hours, and you cannot walk or talk during them 2.  You have a large gush of fluid, or a trickle of fluid that will not stop and you have to wear a pad 3.  You have bleeding that is bright red, heavier than spotting--like menstrual bleeding (spotting can be normal in early labor or after a check of your cervix) 4.  You do not feel the baby moving like he/she normally does  

## 2019-01-17 LAB — CERVICOVAGINAL ANCILLARY ONLY
Chlamydia: NEGATIVE
Molecular Disclaimer: NEGATIVE
Molecular Disclaimer: NORMAL
Neisseria Gonorrhea: NEGATIVE

## 2019-01-18 LAB — STREP GP B NAA: Strep Gp B NAA: POSITIVE — AB

## 2019-01-21 ENCOUNTER — Encounter: Payer: Self-pay | Admitting: Advanced Practice Midwife

## 2019-01-21 DIAGNOSIS — O99820 Streptococcus B carrier state complicating pregnancy: Secondary | ICD-10-CM | POA: Insufficient documentation

## 2019-01-24 ENCOUNTER — Telehealth (INDEPENDENT_AMBULATORY_CARE_PROVIDER_SITE_OTHER): Payer: Medicaid Other

## 2019-01-24 DIAGNOSIS — Z3480 Encounter for supervision of other normal pregnancy, unspecified trimester: Secondary | ICD-10-CM

## 2019-01-24 DIAGNOSIS — O99820 Streptococcus B carrier state complicating pregnancy: Secondary | ICD-10-CM

## 2019-01-24 DIAGNOSIS — Z3A38 38 weeks gestation of pregnancy: Secondary | ICD-10-CM

## 2019-01-24 NOTE — Progress Notes (Signed)
S/w patient for mychart visit. Pt reports fetal movement with occasional contractions. Pt states she does not currently have BP cuff with her. Pt requested tele visit.

## 2019-01-24 NOTE — Progress Notes (Signed)
   TELEHEALTH VIRTUAL OBSTETRICS VISIT ENCOUNTER NOTE  I connected with Robin I Feltes on 01/24/19 at  3:30 PM EDT by telephone at home and verified that I am speaking with the correct person using two identifiers.   I discussed the limitations, risks, security and privacy concerns of performing an evaluation and management service by telephone and the availability of in person appointments. I also discussed with the patient that there may be a patient responsible charge related to this service. The patient expressed understanding and agreed to proceed.  Subjective:  Robin Garrison is a 18 y.o. G1P0 at [redacted]w[redacted]d being followed for ongoing prenatal care.  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  Patient reports no complaints. Reports fetal movement. Denies any contractions, vaginal discharge, bleeding, or leaking of fluid.   The following portions of the patient's history were reviewed and updated as appropriate: allergies, current medications, past family history, past medical history, past social history, past surgical history and problem list.   Objective:   General:  Alert, oriented and cooperative.   Mental Status: Normal mood and affect perceived. Normal judgment and thought content.  Rest of physical exam deferred due to type of encounter  Assessment and Plan:  Pregnancy: G1P0 at [redacted]w[redacted]d 1. Supervision of other normal pregnancy, antepartum -Anticipatory guidance for upcoming appts. -Discussed and reviewed postpartum planning including contraception, pediatricians, and infant feedings and circumcision  desires/costs. *Patient desires to breastfeed and is considering pills for contraception.  2. GBS (group B Streptococcus carrier), +RV culture, currently pregnant -Educated on GBS bacteria including what it is, why we test, and how and when we treat if needed.  Term labor symptoms and general obstetric precautions including but not limited to vaginal bleeding, contractions, leaking of fluid and fetal movement were reviewed in detail with the patient.    I discussed the assessment and treatment plan with the patient. The patient was provided an opportunity to ask questions and all were answered. The patient agreed with the plan and demonstrated an understanding of the instructions. The patient was advised to call back or seek an in-person office evaluation/go to [facility] at [facility] for any urgent or concerning symptoms. Please refer to After Visit Summary for other counseling recommendations.   I provided 7 minutes of non-face-to-face time during this encounter.  Return in about 1 week (around 01/31/2019) for LR-ROB in person.  No future appointments.  Maryann Conners, [place] for [employer], [place]

## 2019-01-24 NOTE — Patient Instructions (Signed)

## 2019-01-25 ENCOUNTER — Telehealth: Payer: Self-pay | Admitting: *Deleted

## 2019-01-25 NOTE — Telephone Encounter (Signed)
Pt called to office with questions about treatment for GBS, thought Rx was being sent to pharmacy. Pt made aware that she will be treated with IV antibiotics while she is in labor.  She does not need Rx at pharmacy.  Pt states understanding.

## 2019-02-02 ENCOUNTER — Other Ambulatory Visit: Payer: Self-pay

## 2019-02-02 ENCOUNTER — Ambulatory Visit (INDEPENDENT_AMBULATORY_CARE_PROVIDER_SITE_OTHER): Payer: Medicaid Other | Admitting: Nurse Practitioner

## 2019-02-02 VITALS — BP 124/80 | HR 90 | Wt 201.0 lb

## 2019-02-02 DIAGNOSIS — Z3480 Encounter for supervision of other normal pregnancy, unspecified trimester: Secondary | ICD-10-CM

## 2019-02-02 DIAGNOSIS — O368130 Decreased fetal movements, third trimester, not applicable or unspecified: Secondary | ICD-10-CM

## 2019-02-02 DIAGNOSIS — Z3A39 39 weeks gestation of pregnancy: Secondary | ICD-10-CM

## 2019-02-02 NOTE — Progress Notes (Signed)
    Subjective:  Robin Garrison is a 18 y.o. G1P0 at [redacted]w[redacted]d being seen today for ongoing prenatal care.  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  Patient reports baby moves 3 times a day only.  Contractions: Not present. Vag. Bleeding: None.  Movement: Present. Denies leaking of fluid.   The following portions of the patient's history were reviewed and updated as appropriate: allergies, current medications, past family history, past medical history, past social history, past surgical history and problem list. Problem list updated.  Objective:   Vitals:   02/02/19 1048  BP: 124/80  Pulse: 90  Weight: 201 lb (91.2 kg)    Fetal Status: Fetal Heart Rate (bpm): 137 Fundal Height: 37 cm Movement: Present  Presentation: Vertex  General:  Alert, oriented and cooperative. Patient is in no acute distress.  Skin: Skin is warm and dry. No rash noted.   Cardiovascular: Normal heart rate noted  Respiratory: Normal respiratory effort, no problems with respiration noted  Abdomen: Soft, gravid, appropriate for gestational age. Pain/Pressure: Absent     Pelvic:  Cervical exam performed Dilation: Closed   Station: -3  Extremities: Normal range of motion.  Edema: Trace  Mental Status: Normal mood and affect. Normal behavior. Normal judgment and thought content.   Urinalysis:      Assessment and Plan:  Pregnancy: G1P0 at [redacted]w[redacted]d  1. Supervision of other normal pregnancy, antepartum Discussed induction.  Client does not want to be induced but discussed the reason for induction as the placenta does not function as well  after 41 weeks.  Client in agreement for induction.  2.  Decreased fetal movement Needs NST - baby moving only 3 times a day.  Has not eaten yet today as she was not feeling well this AM.  Will send to have lunch and then return to office for NST today.  Term labor symptoms and general obstetric precautions including but not limited to vaginal bleeding, contractions, leaking of fluid and fetal movement were reviewed in detail with the patient. Please refer to After Visit Summary for other counseling recommendations.  Return today (on 02/02/2019) for Needs to return for NST nurse visit today and in one week in person ROB with NST.  Earlie Server, RN, MSN, NP-BC Nurse Practitioner, [facility] for [employer], [place] [facility] 02/02/2019 11:47 AM

## 2019-02-02 NOTE — Patient Instructions (Addendum)
AREA PEDIATRIC/FAMILY PRACTICE PHYSICIANS  Central/Southeast  [phone]) . [facility] Davy Pique, MD; Gwendlyn Deutscher, MD; Walker Kehr, MD; Andria Frames, MD; McDiarmid, MD; Dutch Quint, MD; Nori Riis, MD; Mingo Amber, [address] [place], [place] [address] o [phone] o Mon-Fri 8:30-12:30, 1:30-5:00 o Providers come to see babies at [facility] o Accepting Medicaid . [place] [facility] [place] providers who accept newborns: Dorthy Cooler, MD; Orland Mustard, MD; Stephanie Acre, MD o [place], [place], [place] [address] o [phone] o Mon-Fri 8:00-5:30 o Babies seen by providers at [facility] o Does NOT accept Medicaid o Please call early in hospitalization for appointment (limited availability)  . Mustard Shelton, MD o [address] [place], [place] [address] o [phone] o Mon, Tue, Thur, Fri 8:30-5:00, Wed 10:00-7:00 (closed 1-2pm) o Babies seen by [facility] providers o Accepting Medicaid . Fleming, MD o [place], [place], [place] [address] o [phone] o Mon-Fri 8:30-5:00, Sat 8:30-12:00 o Provider comes to see babies at [place] Medicaid o Must have been referred from current patients or contacted office prior to delivery . [place] [facility] ([place] for [place]) Franne Forts, MD; Tamera Punt, MD; Doneen Poisson, MD; Fatima Sanger, MD; Wynetta Emery, MD; Jess Barters, MD; Tami Ribas, MD; Herbert Moors, MD; Derrell Lolling, MD; Dorothyann Peng, MD; Lucious Groves, NP; Baldo Ash, NP o [place], [place], [place] [address] o [phone] o Mon, Tue, Thur, Fri 8:30-5:30, Wed 9:30-5:30, Sat 8:30-12:30 o Babies seen by [facility] providers o Accepting Medicaid o Only accepting infants of first-time parents or siblings of current patients [facility] discharge coordinator will make follow-up appointment . Baltazar Najjar o [place] [address],  [place], [place]  [address] o [phone]   Fax - [phone] . [facility] o [address], [place], [place]  [address] o Phone - [phone]   Fax [phone] . [place], [place], [place], [place]  [address] o [phone]  East/Northeast [place] [phone]) . [facility] Reginal Lutes, MD; Jacklynn Ganong, MD; Torrie Mayers, MD; MD; Rosana Hoes, MD; Servando Salina, MD; Rose Fillers, MD; Rex Kras, MD; Corinna Capra, MD; Volney American, MD; Trilby Drummer, MD; Janann Colonel, MD; Jimmye Norman, [place], [place], [place] [address] o [phone] o Mon-Fri 8:30-5:00 (extended evenings Mon-Thur as needed), Sat-Sun 10:00-1:00 o Providers come to see babies at [place] Medicaid for families of first-time babies and families with all children in the household age 16 and under. Must register with office prior to making appointment (M-F only). . Comfort, NP; Tomi Bamberger, MD; Redmond School, MD; [place], [address] [place], [place] [address] o [phone] o Mon-Fri 8:00-5:00 o Babies seen by providers at [facility] o Does NOT accept Medicaid/Commercial Insurance Only . [facility] [place] ([facility])  Marnee Guarneri, MD; Drema Dallas, MD; Montine Circle, MD; Vilma Prader, MD; Vanita Panda, MD; Alfonso Ramus, MD; Ruthann Cancer, MD; Roxanne Mins, MD; Rosalva Ferron, MD; Polly Cobia, MD o [place]., [place], [place] [address] o [phone] o Mon-Fri 8:30-5:30, Sat (Oct.-Mar.) 9:00-1:00 o Babies seen by providers at [place] [phone]) . [facility] Peers, MD; Suzan Slick, MD o [place] [address], [place], [place] [address] o [phone] o Mon-Fri 8:30-5:00, Sat 8:30-12:00 o Providers come to see babies at [facility] o Does NOT accept Medicaid . [facility] Ricci Barker, PA; Mannie Stabile, MD; [place], [place]; Nancy Fetter, MD; Moreen Fowler, [place],  [place], [place] [address] o [phone] o Mon-Fri 8:00-5:00 o Babies seen by providers at [facility] o Does NOT accept Medicaid o Only accepting babies of parents who are patients o Please call early in hospitalization for appointment (limited availability) . [facility] Blanca Friend, MD; Sharlene Motts, MD; Rod Can, MD; Warner Mccreedy, NP; Sabra Heck, MD; Ermalinda Memos, MD; Sharlett Iles, NP; Aurther Loft, MD; Jerrye Beavers, MD; Marcello Moores, MD; Berline Lopes, MD; Charolette Forward, MD o [place]. [place], [place], [place] [address] o [phone] o Mon-Fri 8:00-5:00, Sat 9:00-12:00 o Providers come to see babies at [facility] o Does NOT accept [facility] [phone]) . [place] [facility] [place] providers accepting new patients: Dayna Ramus, NP; [place], [place], [place], [place] [address] o [phone] o Mon-Fri 8:00-5:00 o Babies seen by providers at [facility] o Does NOT accept Medicaid o Only accepting babies of parents who are patients o Please call early in hospitalization for appointment (limited availability) . [facility] Oswaldo Conroy, MD; Sheran Lawless, MD o [place] [address]., [place], [place] [address] o [phone] (press 1 to schedule appointment) o Mon-Fri 8:00-5:00 o Providers come to see babies at [facility] o Does NOT accept Medicaid . [facility] Jodi Mourning, MD o [address], [place], [place] [address] o [phone] o Mon-Fri 8:30-5:00 (lunch 12:30-1:00), extended hours by appointment only Wed 5:00-6:30 o Babies seen by [facility] providers o Accepting Medicaid . [place] at Evalyn Casco, MD; [place], MD; Ethlyn Gallery, MD o [place], [place], [place] [address] o [phone] o Mon-Fri 8:00-5:00 o Babies seen by [facility] providers o Does NOT accept Medicaid . Therapist, music at [place], MD; Yong Channel, MD; [place], [place] [address] [place]., [place], [place] [address] o  [phone] o Mon-Fri 8:00-5:00 o Babies seen by [facility] providers o Does NOT accept Medicaid . [place], [place]; [place], [place]; [place], NP; Albertina Parr, MD; Frederic Jericho, MD; Ronney Lion, MD; Carlos Levering, NP; Jerelene Redden, NP; Tomasita Crumble, NP; Ronelle Nigh, NP; Corinna Lines, MD; [place], MD o [place]., [place], [place] [address] o [phone] o Mon-Fri 8:30-5:00, Sat 10:00-1:00 o Providers come to see babies at [facility] o Does NOT accept Medicaid o Free prenatal information session Tuesdays at 4:45pm . [facility] Porfirio Oar, MD; [place], [place]; [place], [place]; [place], [place]., [place] [address] o [phone] o Mon-Fri 7:30-5:30 o Babies seen by [facility] providers . [facility] o [address], [place], [place], [place]  [address] o [phone]   Fax - [phone]  [place] [phone] & [phone]) . [place], [place] o [address] [place], [place] [address] o [phone] o Mon-Thur 8:00-6:00 o Providers come to see babies at [place] Medicaid . [place], NP; [place]ord Aase, MD; [place], [place]; [place], [place]., [place], [place] [address] o [phone] o Mon-Thur 7:30-7:30, Fri 7:30-4:30 o Babies seen by [facility] providers o Accepting Medicaid . [facility] Nyra Jabs, MD; Cristino Martes, NP; Gertie Baron, MD o [place] [address], [place], [place] [address] o [phone] o Mon-Fri 8:30-5:00, Sat 8:30-12:00 o Providers come to see babies at [place] Medicaid o Must have "Meet & Greet" appointment at office prior to delivery . [place] ([place]) o [place],  MD; Juleen China, MD; Clydene Laming, [place] [address], [place], [place] [address] o [phone] o Mon-Wed 8:00-6:00, Thur-Fri 8:00-5:00, Sat 9:00-12:00 o Providers come to see  babies at [facility] o Does NOT accept Medicaid o Only accepting siblings of current patients . [facility], [place], [place], [place]  [address] o [phone]   Fax [phone] . [place] at [place] [address] [place], [place]  [address] o [phone]   Fax - [place] [phone] & [phone]) . Therapist, music at [place], DO; [place], [place]., [place], [place] [address] o [phone] o Mon-Fri 7:00-5:00 o Babies seen by [facility] providers o Does NOT accept Medicaid . [place], MD; [place], [place]; [place], [place], [place], [place] [address] o [phone] o Mon-Fri 8:00-5:00 o Babies seen by [facility] providers o Accepting Medicaid . [place], MD; [place], [place]; [place], NP; [place], [place] [address], [place], [place] [address] o [phone] o Mon-Fri 8:00-5:00 o Babies seen by providers at [place] [phone]) . [facility] [place], [place] o [address] [place], [place] [address] o [phone] o Mon-Fri 8:00-5:00 o Babies seen by [facility] providers o Does NOT accept Medicaid o Limited availability, please call early in hospitalization to schedule follow-up . [facility] Leilani Merl, PA; Maisie Fus, MD; [place], MD; [place], [place]; Jeannine Kitten, MD; [place], [place] [facility] [address], [place], [place] [address] o [phone] o Mon-Fri 8:30-5:00, Sat 9:00-12:00 o Babies seen by providers at [facility] o Accepting Medicaid o Please register online then schedule online or call office o [email or web address] . [place] ([place] [facility] [place]) Kristian Covey, NP; Dwyane Dee, MD; Leonidas Romberg, PA o [address] [place], [place], [place] [address] o [phone] o Mon-Fri 8:00-5:00 o Babies seen by providers at [facility] o Accepting Medicaid . [place] ([facility] [employer]) Dairl Ponder, MD; Rayvon Char, NP; Melina Modena, MD o [address] [place], [place], [place] [address] o [phone] o Mon-Fri 8:00-5:30, Sat&Sun by appointment (phones open at 8:30) o Babies seen by [facility] providers o Accepting Medicaid o Must be a first-time baby or sibling of current patient . [place], [address], [place], [place]  [address] o [phone]   Fax - [phone]  [place] [phone] & [phone]) . [place], [place]; [place], [place]; Benjamine Mola, MD; [place], [place]; Harrell Lark, MD o [address], [place], [place] [address] o [phone] o Mon-Thur 8:00-7:00, Fri 8:00-5:00, Sat 8:00-12:00, Sun 9:00-12:00 o Babies seen by [facility] providers o Accepting Medicaid . [facility] at [facility], MD; Ruthann Cancer, MD; [facility], MD o [address] [place], [place], [place] [address] o [phone] o Mon-Thur 8:00-5:00 o Babies seen by providers at [facility] o Accepting Medicaid . [facility] at [place], MD; Co[place], MD; Amedeo Plenty, MD; Bobby Rumpf, MD; [place], MD; Lavonia Drafts, MD; Ruthann Cancer, MD; Selinda Eon, MD; Audie Box, MD; Jim Like, MD; Christie Nottingham, MD; Hubbard Hartshorn, MD; [place] Nunnery, MD o [place]., [place], [place]  [address] o [phone] o Mon-Fri 8:00-5:30, Sat (Oct.-Mar.) 9:00-1:00 o Babies seen by providers at [facility] o Accepting Medicaid o Must fill out new patient packet, available online at [email or web address] . [place] ([facility]) Barnabas Lister, NP; Kenton Kingfisher, NP; Claiborne Billings, NP; Rolla Plate, MD; [place], [place];  [place]ola Rhine, MD; Tyron Russell, MD; Delia Chimes, NP o [address], [place], [place] [address] o [phone] o Mon-Thur 8:00-5:30, Fri 8:00-5:00 o Babies seen by providers at [place] [phone]) . [place], [place]; [place], MD; Dennard Schaumann, MD; [place], [place] o [address] [place], [place] [address] o [phone] o Mon-Fri 8:00-5:00 o Babies seen by providers at [place] [phone]) . [place] at [place], [place]; Olen Pel, MD; [place], [place], [place], [place] [address] o [phone] o Mon-Fri 8:00-5:00 o Babies seen by providers at [facility] o Does NOT accept Medicaid o Limited appointment availability, please call early in hospitalization  . Therapist, music at [place], [place]; [place], [address], [place], [place] [address] o [phone] o Mon-Fri 8:00-5:00 o Babies seen by [facility] providers o Does NOT accept Medicaid . [facility] Su [place], MD; Guy Sandifer, MD; [place], [place]; [place], [place] [address], [place], [place] [address] o [phone] o Mon-Fri 8:00-5:00 o After hours clinic [facility] [place], [place] [address]) [phone] Mon-Fri 5:00-8:00, Sat 12:00-6:00, Sun 10:00-4:00 o Babies seen by [facility] providers o Accepting Medicaid . [place] at [facility] o [address], [place], [place]  [address] o [phone]   Fax - [phone]  [place] [phone]) . Therapist, music at [facility], MD o [address] [place], [place], [place] [address] o [phone] o Mon-Fri 8:00-5:00 o Babies seen by [facility] providers o Does NOT accept Medicaid . [place] ([place] [facility] [place]) Bing Neighbors, MD o [address] [place], [place], [place] [address] o [phone] o  Mon-Thur 8:00-7:00, Fri 8:00-5:00, Sat 8:00-12:00 o Babies seen by providers at [facility] o Accepting Medicaid - but does not have vaccinations in office (must be received elsewhere) o Limited availability, please call early in hospitalization   ([phone]) . [place], MD o [address] [place] [address] o [phone]  Fax [phone]      Signs and Symptoms of Labor Labor is your body's natural process of moving your baby, placenta, and umbilical cord out of your uterus. The process of labor usually starts when your baby is full-term, between 44 and 40 weeks of pregnancy. How will I know when I am close to going into labor? As your body prepares for labor and the birth of your baby, you may notice the following symptoms in the weeks and days before true labor starts:  Having a strong desire to get your home ready to receive your new baby. This is called nesting. Nesting may be a sign that labor is approaching, and it may occur several weeks before birth. Nesting may involve cleaning and organizing your home.  Passing a small amount of thick, bloody mucus out of your vagina (normal bloody show or losing your mucus plug). This may happen more than a week before labor begins, or it might  occur right before labor begins as the opening of the cervix starts to widen (dilate). For some women, the entire mucus plug passes at once. For others, smaller portions of the mucus plug may gradually pass over several days.  Your baby moving (dropping) lower in your pelvis to get into position for birth (lightening). When this happens, you may feel more pressure on your bladder and pelvic bone and less pressure on your ribs. This may make it easier to breathe. It may also cause you to need to urinate more often and have problems with bowel movements.  Having "practice contractions" (Braxton Hicks contractions) that occur at irregular (unevenly  spaced) intervals that are more than 10 minutes apart. This is also called false labor. False labor contractions are common after exercise or sexual activity, and they will stop if you change position, rest, or drink fluids. These contractions are usually mild and do not get stronger over time. They may feel like: ? A backache or back pain. ? Mild cramps, similar to menstrual cramps. ? Tightening or pressure in your abdomen. Other early symptoms that labor may be starting soon include:  Nausea or loss of appetite.  Diarrhea.  Having a sudden burst of energy, or feeling very tired.  Mood changes.  Having trouble sleeping. How will I know when labor has begun? Signs that true labor has begun may include:  Having contractions that come at regular (evenly spaced) intervals and increase in intensity. This may feel like more intense tightening or pressure in your abdomen that moves to your back. ? Contractions may also feel like rhythmic pain in your upper thighs or back that comes and goes at regular intervals. ? For first-time mothers, this change in intensity of contractions often occurs at a more gradual pace. ? Women who have given birth before may notice a more rapid progression of contraction changes.  Having a feeling of pressure in the vaginal area.  Your water breaking (rupture of membranes). This is when the sac of fluid that surrounds your baby breaks. When this happens, you will notice fluid leaking from your vagina. This may be clear or blood-tinged. Labor usually starts within 24 hours of your water breaking, but it may take longer to begin. ? Some women notice this as a gush of fluid. ? Others notice that their underwear repeatedly becomes damp. Follow these instructions at home:   When labor starts, or if your water breaks, call your health care provider or nurse care line. Based on your situation, they will determine when you should go in for an exam.  When you are in  early labor, you may be able to rest and manage symptoms at home. Some strategies to try at home include: ? Breathing and relaxation techniques. ? Taking a warm bath or shower. ? Listening to music. ? Using a heating pad on the lower back for pain. If you are directed to use heat:  Place a towel between your skin and the heat source.  Leave the heat on for 20-30 minutes.  Remove the heat if your skin turns bright red. This is especially important if you are unable to feel pain, heat, or cold. You may have a greater risk of getting burned. Get help right away if:  You have painful, regular contractions that are 5 minutes apart or less.  Labor starts before you are [redacted] weeks along in your pregnancy.  You have a fever.  You have a headache that does not go away.  You have bright red blood coming from your vagina.  You do not feel your baby moving.  You have a sudden onset of: ? Severe headache with vision problems. ? Nausea, vomiting, or diarrhea. ? Chest pain or shortness of breath. These symptoms may be an emergency. If your health care provider recommends that you go to the hospital or birth center where you plan to deliver, do not drive yourself. Have someone else drive you, or call emergency services (911 in the [place].) Summary  Labor is your body's natural process of moving your baby, placenta, and umbilical cord out of your uterus.  The process of labor usually starts when your baby is full-term, between 86 and 40 weeks of pregnancy.  When labor starts, or if your water breaks, call your health care provider or nurse care line. Based on your situation, they will determine when you should go in for an exam. This information is not intended to replace advice given to you by your health care provider. Make sure you discuss any questions you have with your health care provider. Document Released: 09/11/2016 Document Revised: 01/04/2017 Document Reviewed: 09/11/2016 Elsevier  Patient Education  2020 [employer].

## 2019-02-05 ENCOUNTER — Other Ambulatory Visit: Payer: Self-pay

## 2019-02-05 ENCOUNTER — Inpatient Hospital Stay (EMERGENCY_DEPARTMENT_HOSPITAL)
Admission: AD | Admit: 2019-02-05 | Discharge: 2019-02-06 | Disposition: A | Discharge status: Home / Self Care | Payer: Medicaid Other | Source: Home / Self Care | Attending: Obstetrics & Gynecology | Admitting: Obstetrics & Gynecology

## 2019-02-05 DIAGNOSIS — O26893 Other specified pregnancy related conditions, third trimester: Secondary | ICD-10-CM | POA: Insufficient documentation

## 2019-02-05 DIAGNOSIS — Z3A39 39 weeks gestation of pregnancy: Secondary | ICD-10-CM | POA: Insufficient documentation

## 2019-02-05 DIAGNOSIS — Z0371 Encounter for suspected problem with amniotic cavity and membrane ruled out: Secondary | ICD-10-CM

## 2019-02-06 ENCOUNTER — Encounter (HOSPITAL_COMMUNITY): Payer: Self-pay

## 2019-02-06 ENCOUNTER — Inpatient Hospital Stay (HOSPITAL_COMMUNITY): Payer: Medicaid Other | Admitting: Anesthesiology

## 2019-02-06 ENCOUNTER — Inpatient Hospital Stay (HOSPITAL_COMMUNITY)
Admission: AD | Admit: 2019-02-06 | Discharge: 2019-02-09 | DRG: 807 | Disposition: A | Discharge status: Home / Self Care | Payer: Medicaid Other | Attending: Family Medicine | Admitting: Family Medicine

## 2019-02-06 ENCOUNTER — Inpatient Hospital Stay (HOSPITAL_COMMUNITY)
Admission: AD | Admit: 2019-02-06 | Discharge: 2019-02-06 | Disposition: A | Discharge status: Home / Self Care | Payer: Medicaid Other | Source: Home / Self Care | Attending: Obstetrics & Gynecology | Admitting: Obstetrics & Gynecology

## 2019-02-06 ENCOUNTER — Other Ambulatory Visit: Payer: Self-pay

## 2019-02-06 DIAGNOSIS — K5190 Ulcerative colitis, unspecified, without complications: Secondary | ICD-10-CM | POA: Diagnosis present

## 2019-02-06 DIAGNOSIS — Z3A39 39 weeks gestation of pregnancy: Secondary | ICD-10-CM

## 2019-02-06 DIAGNOSIS — O99820 Streptococcus B carrier state complicating pregnancy: Secondary | ICD-10-CM

## 2019-02-06 DIAGNOSIS — Z3A40 40 weeks gestation of pregnancy: Secondary | ICD-10-CM | POA: Diagnosis not present

## 2019-02-06 DIAGNOSIS — Z20828 Contact with and (suspected) exposure to other viral communicable diseases: Secondary | ICD-10-CM | POA: Diagnosis present

## 2019-02-06 DIAGNOSIS — O99824 Streptococcus B carrier state complicating childbirth: Secondary | ICD-10-CM | POA: Diagnosis not present

## 2019-02-06 DIAGNOSIS — O479 False labor, unspecified: Secondary | ICD-10-CM

## 2019-02-06 DIAGNOSIS — O471 False labor at or after 37 completed weeks of gestation: Secondary | ICD-10-CM | POA: Insufficient documentation

## 2019-02-06 DIAGNOSIS — Z0371 Encounter for suspected problem with amniotic cavity and membrane ruled out: Secondary | ICD-10-CM

## 2019-02-06 DIAGNOSIS — Z3480 Encounter for supervision of other normal pregnancy, unspecified trimester: Secondary | ICD-10-CM

## 2019-02-06 DIAGNOSIS — O480 Post-term pregnancy: Secondary | ICD-10-CM | POA: Diagnosis present

## 2019-02-06 LAB — CBC
HCT: 36.5 % (ref 36.0–46.0)
Hemoglobin: 11.9 g/dL — ABNORMAL LOW (ref 12.0–15.0)
MCH: 29.1 pg (ref 26.0–34.0)
MCHC: 32.6 g/dL (ref 30.0–36.0)
MCV: 89.2 fL (ref 80.0–100.0)
Platelets: 263 10*3/uL (ref 150–400)
RBC: 4.09 MIL/uL (ref 3.87–5.11)
RDW: 14.4 % (ref 11.5–15.5)
WBC: 8.4 10*3/uL (ref 4.0–10.5)
nRBC: 0 % (ref 0.0–0.2)

## 2019-02-06 LAB — TYPE AND SCREEN
ABO/RH(D): B POS
Antibody Screen: NEGATIVE

## 2019-02-06 LAB — POCT FERN TEST: POCT Fern Test: NEGATIVE

## 2019-02-06 LAB — SARS CORONAVIRUS 2 BY RT PCR (HOSPITAL ORDER, PERFORMED IN ~~LOC~~ HOSPITAL LAB): SARS Coronavirus 2: NEGATIVE

## 2019-02-06 LAB — ABO/RH: ABO/RH(D): B POS

## 2019-02-06 MED ORDER — FLEET ENEMA 7-19 GM/118ML RE ENEM: 1.0000 | ENEMA | RECTAL | Status: DC | PRN

## 2019-02-06 MED ORDER — OXYTOCIN 40 UNITS IN NORMAL SALINE INFUSION - SIMPLE MED: 1.0000 m[IU]/min | INTRAVENOUS | Status: DC

## 2019-02-06 MED ORDER — SODIUM CHLORIDE 0.9 % IV SOLN
5.0000 10*6.[IU] | Freq: Once | INTRAVENOUS | Status: AC
  Administered 2019-02-06: 5 10*6.[IU] via INTRAVENOUS
  Filled 2019-02-06: qty 5

## 2019-02-06 MED ORDER — LIDOCAINE HCL (PF) 1 % IJ SOLN
30.0000 mL | INTRAMUSCULAR | Status: DC | PRN
  Filled 2019-02-06: qty 30

## 2019-02-06 MED ORDER — LIDOCAINE HCL (PF) 1 % IJ SOLN
INTRAMUSCULAR | Status: DC | PRN
  Administered 2019-02-06 (×2): 4 mL via EPIDURAL

## 2019-02-06 MED ORDER — OXYCODONE-ACETAMINOPHEN 5-325 MG PO TABS: 2.0000 | ORAL_TABLET | ORAL | Status: DC | PRN

## 2019-02-06 MED ORDER — ONDANSETRON HCL 4 MG/2ML IJ SOLN: 4.0000 mg | Freq: Four times a day (QID) | INTRAMUSCULAR | Status: DC | PRN

## 2019-02-06 MED ORDER — EPHEDRINE 5 MG/ML INJ: 10.0000 mg | INTRAVENOUS | Status: DC | PRN

## 2019-02-06 MED ORDER — DIPHENHYDRAMINE HCL 50 MG/ML IJ SOLN: 12.5000 mg | INTRAMUSCULAR | Status: DC | PRN

## 2019-02-06 MED ORDER — SOD CITRATE-CITRIC ACID 500-334 MG/5ML PO SOLN: 30.0000 mL | ORAL | Status: DC | PRN

## 2019-02-06 MED ORDER — FENTANYL-BUPIVACAINE-NACL 0.5-0.125-0.9 MG/250ML-% EP SOLN
12.0000 mL/h | EPIDURAL | Status: DC | PRN
  Filled 2019-02-06: qty 250

## 2019-02-06 MED ORDER — PHENYLEPHRINE 40 MCG/ML (10ML) SYRINGE FOR IV PUSH (FOR BLOOD PRESSURE SUPPORT)
80.0000 ug | PREFILLED_SYRINGE | INTRAVENOUS | Status: DC | PRN
  Filled 2019-02-06: qty 10

## 2019-02-06 MED ORDER — OXYCODONE-ACETAMINOPHEN 5-325 MG PO TABS: 1.0000 | ORAL_TABLET | ORAL | Status: DC | PRN

## 2019-02-06 MED ORDER — MISOPROSTOL 50MCG HALF TABLET: 50.0000 ug | ORAL_TABLET | ORAL | Status: DC | PRN

## 2019-02-06 MED ORDER — PHENYLEPHRINE 40 MCG/ML (10ML) SYRINGE FOR IV PUSH (FOR BLOOD PRESSURE SUPPORT): 80.0000 ug | PREFILLED_SYRINGE | INTRAVENOUS | Status: DC | PRN

## 2019-02-06 MED ORDER — LACTATED RINGERS IV SOLN
INTRAVENOUS | Status: DC
  Administered 2019-02-06 – 2019-02-07 (×2): via INTRAVENOUS

## 2019-02-06 MED ORDER — SODIUM CHLORIDE (PF) 0.9 % IJ SOLN
INTRAMUSCULAR | Status: DC | PRN
  Administered 2019-02-06: 12 mL/h via EPIDURAL

## 2019-02-06 MED ORDER — FENTANYL CITRATE (PF) 100 MCG/2ML IJ SOLN
INTRAMUSCULAR | Status: DC | PRN
  Administered 2019-02-06: 15 ug via INTRATHECAL

## 2019-02-06 MED ORDER — OXYTOCIN BOLUS FROM INFUSION
500.0000 mL | Freq: Once | INTRAVENOUS | Status: AC
  Administered 2019-02-07: 500 mL via INTRAVENOUS

## 2019-02-06 MED ORDER — PENICILLIN G 3 MILLION UNITS IVPB - SIMPLE MED
3.0000 10*6.[IU] | INTRAVENOUS | Status: DC
  Administered 2019-02-07: 3 10*6.[IU] via INTRAVENOUS
  Filled 2019-02-06: qty 100

## 2019-02-06 MED ORDER — OXYTOCIN 40 UNITS IN NORMAL SALINE INFUSION - SIMPLE MED
2.5000 [IU]/h | INTRAVENOUS | Status: DC
  Filled 2019-02-06: qty 1000

## 2019-02-06 MED ORDER — LACTATED RINGERS IV SOLN: 500.0000 mL | Freq: Once | INTRAVENOUS | Status: DC

## 2019-02-06 MED ORDER — TERBUTALINE SULFATE 1 MG/ML IJ SOLN: 0.2500 mg | Freq: Once | INTRAMUSCULAR | Status: DC | PRN

## 2019-02-06 MED ORDER — ACETAMINOPHEN 325 MG PO TABS: 650.0000 mg | ORAL_TABLET | ORAL | Status: DC | PRN

## 2019-02-06 MED ORDER — FENTANYL CITRATE (PF) 100 MCG/2ML IJ SOLN
100.0000 ug | INTRAMUSCULAR | Status: DC | PRN
  Administered 2019-02-06 (×2): 100 ug via INTRAVENOUS
  Filled 2019-02-06 (×3): qty 2

## 2019-02-06 MED ORDER — LACTATED RINGERS IV SOLN: 500.0000 mL | INTRAVENOUS | Status: DC | PRN

## 2019-02-06 NOTE — MAU Provider Note (Signed)
None      S: Ms. Robin Garrison is a 18 y.o. G1P0 at [redacted]w[redacted]d who presents to MAU today complaining contractions q 5-10 minutes since midnight. She denies vaginal bleeding. She denies LOF. She reports normal fetal movement.    O: BP 106/62 (BP Location: Right Arm)   Pulse 92   Temp 98.3 F (36.8 C) (Oral)   Resp 16   LMP 05/03/2018 (LMP Unknown)   SpO2 99%  GENERAL: Well-developed, well-nourished female in no acute distress.  HEAD: Normocephalic, atraumatic.  CHEST: Normal effort of breathing, regular heart rate ABDOMEN: Soft, nontender, gravid  Cervical exam:  Dilation: 2 Effacement (%): 80 Cervical Position: Anterior Presentation: Vertex Exam by:: Robin Norfolk RN   Fetal Monitoring: Baseline: 130 Variability: moderate Accelerations: 15x15 Decelerations: none Contractions: 5-7   A: SIUP at 324w6dFalse labor  P: Discharge home Patient to follow up as scheduled with [facility] Patient may return to MAU as needed  NeWende [place]ott[place]0/19/2020 7:05 AM

## 2019-02-06 NOTE — Anesthesia Procedure Notes (Addendum)
Combined Spinal Epidural Patient location during procedure: OB Start time: 02/06/2019 11:17 PM End time: 02/06/2019 11:20 PM  Staffing Anesthesiologist: Brennan Bailey, MD Performed: anesthesiologist   Preanesthetic Checklist Completed: patient identified, pre-op evaluation, timeout performed, IV checked, risks and benefits discussed and monitors and equipment checked  Epidural Patient position: sitting Prep: site prepped and draped and DuraPrep Patient monitoring: heart rate, continuous pulse ox and blood pressure Approach: midline Location: L3-L4 Injection technique: LOR air  Needle:  Needle type: Tuohy  Needle gauge: 17 G Needle length: 9 cm Needle insertion depth: 7 cm Catheter type: closed end flexible Catheter size: 19 Gauge Catheter at skin depth: 12 cm  Assessment Events: blood not aspirated, injection not painful, no injection resistance, negative IV test and no paresthesia  Additional Notes Patient identified. Risks, benefits, and alternatives discussed with patient including but not limited to bleeding, infection, nerve damage, paralysis, failed block, incomplete pain control, headache, blood pressure changes, nausea, vomiting, reactions to medication, itching, and postpartum back pain. Confirmed with bedside nurse the patient's most recent platelet count. Confirmed with patient that they are not currently taking any anticoagulation, have any bleeding history, or any family history of bleeding disorders. Patient expressed understanding and wished to proceed. All questions were answered. Sterile technique was used throughout the entire procedure. Please see nursing notes for vital signs.   Crisp LOR with Tuohy needle on first pass. [place] 25g 113mm spinal needle introduced through Tuohy with clear CSF return prior to injection of intrathecal medication. Spinal needle withdrawn and epidural catheter threaded easily. Negative aspiration of catheter for heme or CSF prior  to starting epidural infusion. Warning signs of high block given to the patient including shortness of breath, tingling/numbness in hands, complete motor block, or any concerning symptoms with instructions to call for help. Patient was given instructions on fall risk and not to get out of bed. All questions and concerns addressed with instructions to call with any issues or inadequate analgesia.  Patient comfortable with contractions prior to my leaving room. Reason for block:procedure for pain

## 2019-02-06 NOTE — Discharge Instructions (Signed)
Braxton Hicks Contractions Contractions of the uterus can occur throughout pregnancy, but they are not always a sign that you are in labor. You may have practice contractions called Braxton Hicks contractions. These false labor contractions are sometimes confused with true labor. What are Braxton Hicks contractions? Braxton Hicks contractions are tightening movements that occur in the muscles of the uterus before labor. Unlike true labor contractions, these contractions do not result in opening (dilation) and thinning of the cervix. Toward the end of pregnancy (32-34 weeks), Braxton Hicks contractions can happen more often and may become stronger. These contractions are sometimes difficult to tell apart from true labor because they can be very uncomfortable. You should not feel embarrassed if you go to the hospital with false labor. Sometimes, the only way to tell if you are in true labor is for your health care provider to look for changes in the cervix. The health care provider will do a physical exam and may monitor your contractions. If you are not in true labor, the exam should show that your cervix is not dilating and your water has not broken. If there are no other health problems associated with your pregnancy, it is completely safe for you to be sent home with false labor. You may continue to have Braxton Hicks contractions until you go into true labor. How to tell the difference between true labor and false labor True labor  Contractions last 30-70 seconds.  Contractions become very regular.  Discomfort is usually felt in the top of the uterus, and it spreads to the lower abdomen and low back.  Contractions do not go away with walking.  Contractions usually become more intense and increase in frequency.  The cervix dilates and gets thinner. False labor  Contractions are usually shorter and not as strong as true labor contractions.  Contractions are usually irregular.  Contractions  are often felt in the front of the lower abdomen and in the groin.  Contractions may go away when you walk around or change positions while lying down.  Contractions get weaker and are shorter-lasting as time goes on.  The cervix usually does not dilate or become thin. Follow these instructions at home:   Take over-the-counter and prescription medicines only as told by your health care provider.  Keep up with your usual exercises and follow other instructions from your health care provider.  Eat and drink lightly if you think you are going into labor.  If Braxton Hicks contractions are making you uncomfortable: ? Change your position from lying down or resting to walking, or change from walking to resting. ? Sit and rest in a tub of warm water. ? Drink enough fluid to keep your urine pale yellow. Dehydration may cause these contractions. ? Do slow and deep breathing several times an hour.  Keep all follow-up prenatal visits as told by your health care provider. This is important. Contact a health care provider if:  You have a fever.  You have continuous pain in your abdomen. Get help right away if:  Your contractions become stronger, more regular, and closer together.  You have fluid leaking or gushing from your vagina.  You pass blood-tinged mucus (bloody show).  You have bleeding from your vagina.  You have low back pain that you never had before.  You feel your baby's head pushing down and causing pelvic pressure.  Your baby is not moving inside you as much as it used to. Summary  Contractions that occur before labor are   called Braxton Hicks contractions, false labor, or practice contractions.  Braxton Hicks contractions are usually shorter, weaker, farther apart, and less regular than true labor contractions. True labor contractions usually become progressively stronger and regular, and they become more frequent.  Manage discomfort from Braxton Hicks contractions  by changing position, resting in a warm bath, drinking plenty of water, or practicing deep breathing. This information is not intended to replace advice given to you by your health care provider. Make sure you discuss any questions you have with your health care provider. Document Released: 08/20/2016 Document Revised: 03/19/2017 Document Reviewed: 08/20/2016 Elsevier Patient Education  2020 Elsevier Inc.  

## 2019-02-06 NOTE — Discharge Instructions (Signed)

## 2019-02-06 NOTE — MAU Provider Note (Signed)
First Provider Initiated Contact with Patient 02/06/19 0044       S: Robin Garrison is a 18 y.o. G1P0 at [redacted]w[redacted]d  who presents to MAU today complaining of leaking of fluid since 2100. She denies vaginal bleeding. She endorses contractions. She reports normal fetal movement.    O: BP 123/83 (BP Location: Right Arm)   Pulse 91   Temp 97.8 F (36.6 C) (Oral)   Resp 16   LMP 05/03/2018 (LMP Unknown)   SpO2 98%  GENERAL: Well-developed, well-nourished female in no acute distress.  HEAD: Normocephalic, atraumatic.  CHEST: Normal effort of breathing, regular heart rate ABDOMEN: Soft, nontender, gravid PELVIC: Normal external female genitalia. Vagina is pink and rugated. Cervix with normal contour, no lesions. Normal discharge.  No pooling.   Cervical exam:  Dilation: 1 Exam by:: Dellia Cloud CNM   Fetal Monitoring: Baseline: 120 Variability: moderate Accelerations: 15x15 Decelerations: none Contractions: none  Fern: negative x2   A: SIUP at [redacted]w[redacted]d  Membranes intact  P: Discharge home  Patient to keep appointment as scheduled with [facility] Patient to return to MAU as needed   Wende Mott, CNM 02/06/2019 1:00 AM

## 2019-02-06 NOTE — MAU Note (Signed)
Pt reports to MAU with complaints of contractions and leaking fluids, states she may have been leaking yesterday, denies bleeding, confirms fetal movement.

## 2019-02-06 NOTE — H&P (Signed)
LABOR AND DELIVERY ADMISSION HISTORY AND PHYSICAL NOTE  Robin Garrison is a 18 y.o. female G1P0 with IUP at [redacted]w[redacted]d by LMP c/w 21 wk [place] presenting for IOL for Cat II FHT and early labor.   She reports positive fetal movement. She endorses some LOF, wondering if her water broke, deniesvaginal bleeding.   She plans on bottle and breast feeding. She requests OCP's for birth control.  Prenatal History/Complications: PNC at [facility]:  @[redacted]w[redacted]d , CWD, normal anatomy, cephalic presentation, anterior placenta, 52%ile, EFW 422g Pregnancy complications:  - Hx of ulcerative colitis - GBS+  Past Medical History: Past Medical History:  Diagnosis Date  . Chest pain 03/06/2013   Saw [facility] for CP on exertion in cold weather; cleared for sports, no restrictions  . Cholecystitis   . Colitis   . Proctitis   . Proctitis     Past Surgical History: Past Surgical History:  Procedure Laterality Date  . TONSILECTOMY/ADENOIDECTOMY WITH MYRINGOTOMY      Obstetrical History: OB History    Gravida  1   Para      Term      Preterm      AB      Living        SAB      TAB      Ectopic      Multiple      Live Births              Social History: Social History   Socioeconomic History  . Marital status: Single    Spouse name: Not on file  . Number of children: Not on file  . Years of education: Not on file  . Highest education level: Not on file  Occupational History  . Occupation: [employer]  Social Needs  . Financial resource strain: Not hard at all  . Food insecurity    Worry: Never true    Inability: Never true  . Transportation needs    Medical: No    Non-medical: No  Tobacco Use  . Smoking status: Never Smoker  . Smokeless tobacco: Never Used  Substance and Sexual Activity  . Alcohol use: No    Alcohol/week: 0.0 standard drinks  . Drug use: No  . Sexual activity: Not Currently    Partners: Male    Birth control/protection: Pill  Lifestyle   . Physical activity    Days per week: Patient refused    Minutes per session: Patient refused  . Stress: Not at all  Relationships  . Social 03/08/2013 on phone: Patient refused    Gets together: Patient refused    Attends religious service: Patient refused    Active member of club or organization: Patient refused    Attends meetings of clubs or organizations: Patient refused    Relationship status: Patient refused  Other Topics Concern  . Not on file  Social History Narrative   8th grade    Family History: Family History  Problem Relation Age of Onset  . Hypertension Mother   . Healthy Father   . Diabetes Other   . Diabetes Paternal Aunt   . Vision loss Paternal Grandmother   . Inflammatory bowel disease Neg Hx     Allergies: Allergies  Allergen Reactions  . Lactose Intolerance (Gi) Other (See Comments)    Upset stomach    Medications Prior to Admission  Medication Sig Dispense Refill Last Dose  . folic acid (FOLVITE) 1 MG tablet  2   . Prenatal Vit-Fe Fumarate-FA (PRENATAL MULTIVITAMIN) TABS tablet Take 1 tablet by mouth daily at 12 noon.   02/05/2019 at Unknown time  . Elastic Bandages & Supports (COMFORT FIT MATERNITY SUPP SM) MISC Wear as directed. 1 each 0      Review of Systems  All systems reviewed and negative except as stated in HPI  Physical Exam Blood pressure 111/69, pulse 93, temperature 99.6 F (37.6 C), temperature source Oral, resp. rate 18, height 5\' 2"  (1.575 m), weight 91.2 kg, last menstrual period 05/03/2018, SpO2 100 %. General appearance: alert, oriented, NAD Lungs: normal respiratory effort Heart: regular rate Abdomen: soft, non-tender; gravid, leopolds 2800g Extremities: No calf swelling or tenderness Presentation: cephalic by RN exam Fetal monitoringBaseline: 140 bpm, Variability: Good {> 6 bpm), Accelerations: Reactive and Decelerations: Late Uterine activityFrequency: Every 5 minutes Dilation: 8.5 Effacement (%):  100 Station: 0 Exam by:: MD Dione Plover  Prenatal labs: ABO, Rh: --/--/B POS, B POS Performed at [facility], [place] [address], [place], [place] [address]  [phone]) Antibody: NEG (10/19 2055) Rubella: 1.71 (06/01 1436) RPR: Non Reactive (07/20 0958)  HBsAg: Negative (06/01 1436)  HIV: Non Reactive (07/20 0958)  GC/Chlamydia: neg/neg 01/16/2019  GBS: --Tessie Fass (09/28 0217)  2-hr GTT: normal 11/07/2018 Genetic screening:  Low risk female Panorama Anatomy US: normal  Prenatal Transfer Tool  Maternal Diabetes: No Genetic Screening: Normal Maternal Ultrasounds/Referrals: Normal Fetal Ultrasounds or other Referrals:  None Maternal Substance Abuse:  No Significant Maternal Medications:  None Significant Maternal Lab Results: Group B Strep positive  Results for orders placed or performed during the hospital encounter of 02/06/19 (from the past 24 hour(s))  CBC   Collection Time: 02/06/19  8:55 PM  Result Value Ref Range   WBC 8.4 4.0 - 10.5 K/uL   RBC 4.09 3.87 - 5.11 MIL/uL   Hemoglobin 11.9 (L) 12.0 - 15.0 g/dL   HCT 36.5 36.0 - 46.0 %   MCV 89.2 80.0 - 100.0 fL   MCH 29.1 26.0 - 34.0 pg   MCHC 32.6 30.0 - 36.0 g/dL   RDW 14.4 11.5 - 15.5 %   Platelets 263 150 - 400 K/uL   nRBC 0.0 0.0 - 0.2 %  Type and screen   Collection Time: 02/06/19  8:55 PM  Result Value Ref Range   ABO/RH(D) B POS    Antibody Screen NEG    Sample Expiration      02/09/2019,2359 Performed at [facility], [place] [address], [place], [place] [address]   ABO/Rh   Collection Time: 02/06/19  8:55 PM  Result Value Ref Range   ABO/RH(D)      B POS Performed at [facility] [address], [place],  [address]   Maryann Alar Test   Collection Time: 02/06/19  9:01 PM  Result Value Ref Range   POCT Fern Test Negative = intact amniotic membranes   SARS Coronavirus 2 by RT PCR (hospital order, performed in [facility] hospital lab) Nasopharyngeal Nasopharyngeal Swab   Collection Time:  02/06/19  9:18 PM   Specimen: Nasopharyngeal Swab  Result Value Ref Range   SARS Coronavirus 2 NEGATIVE NEGATIVE    Patient Active Problem List   Diagnosis Date Noted  . Post-dates pregnancy 02/06/2019  . GBS (group B Streptococcus carrier), +RV culture, currently pregnant 01/21/2019  . Supervision of other normal pregnancy, antepartum 09/19/2018  . Vitamin D deficiency 12/28/2014  . 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 Dysmenorrhea  in adolescent 10/03/2014  . Chest pain on exertion 03/06/2013  . 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 Excessive gas 11/24/2012  . Ulcerative colitis without complications (HCC)     Assessment: [place] Jean RosenthalJackson is a 18 y.o. G1P0 at 3269w6d here for early labor and IOL for Cat II tracing at term.  #Labor: Term at midnight and late deceleration on FHT though otherwise reassuring and in early labor. On arrival to the floor patient is 8cm, expectant management. #?LOF: amniosure negative #Pain: IV fentanyl 100mcg q1hr PRN, epidural upon request #FWB: Cat II tracing for late decel but otherwise reassuring with moderate variability and accels #GBS/ID:  Positive, start penicillin #COVID: swab negative #MOF: Bottle and breast #MOC: OCPs #Circ:  N/a, female  #Ulcerative colitis: reports no flares for several months, last visit with GI in 2018 per Care Everywhere. Reports no medicines and no prior abdominal surgeries.   Mary SellaMatthew M [facility] 02/06/2019, 11:12 PM

## 2019-02-06 NOTE — MAU Note (Signed)
Pt reports to MAU and states that she lost her mucus plug and that her contractions are now 2 min apart and stronger.denies bleeding, denies leaking fluid, fetal movement verified.

## 2019-02-06 NOTE — MAU Note (Signed)
Contractions started last night. Now every 2 min. Thinks her water broke about an hour ago. Gush of fluid, did not wear a pad in, is not still coming. Some blood mixed with mucous.

## 2019-02-06 NOTE — Anesthesia Preprocedure Evaluation (Addendum)
Anesthesia Evaluation  Patient identified by MRN, date of birth, ID band Patient awake    Reviewed: Allergy & Precautions, Patient's Chart, lab work & pertinent test results  History of Anesthesia Complications Negative for: history of anesthetic complications  Airway Mallampati: II  TM Distance: >3 FB Neck ROM: Full    Dental no notable dental hx.    Pulmonary neg pulmonary ROS,    Pulmonary exam normal        Cardiovascular negative cardio ROS Normal cardiovascular exam     Neuro/Psych negative neurological ROS  negative psych ROS   GI/Hepatic Neg liver ROS, Ulcerative colitis   Endo/Other  negative endocrine ROS  Renal/GU negative Renal ROS     Musculoskeletal negative musculoskeletal ROS (+)   Abdominal   Peds  Hematology negative hematology ROS (+)   Anesthesia Other Findings   Reproductive/Obstetrics (+) Pregnancy                             Anesthesia Physical Anesthesia Plan  ASA: II  Anesthesia Plan: Combined Spinal and Epidural   Post-op Pain Management:    Induction:   PONV Risk Score and Plan: Treatment may vary due to age or medical condition  Airway Management Planned: Natural Airway  Additional Equipment:   Intra-op Plan:   Post-operative Plan:   Informed Consent: I have reviewed the patients History and Physical, chart, labs and discussed the procedure including the risks, benefits and alternatives for the proposed anesthesia with the patient or authorized representative who has indicated his/her understanding and acceptance.       Plan Discussed with:   Anesthesia Plan Comments:        Anesthesia Quick Evaluation

## 2019-02-07 ENCOUNTER — Encounter (HOSPITAL_COMMUNITY): Payer: Self-pay

## 2019-02-07 DIAGNOSIS — O99824 Streptococcus B carrier state complicating childbirth: Secondary | ICD-10-CM

## 2019-02-07 DIAGNOSIS — Z3A39 39 weeks gestation of pregnancy: Secondary | ICD-10-CM

## 2019-02-07 LAB — RPR: RPR Ser Ql: NONREACTIVE

## 2019-02-07 MED ORDER — ACETAMINOPHEN 325 MG PO TABS
650.0000 mg | ORAL_TABLET | ORAL | Status: DC | PRN
  Administered 2019-02-07 – 2019-02-08 (×4): 650 mg via ORAL
  Filled 2019-02-07 (×4): qty 2

## 2019-02-07 MED ORDER — SIMETHICONE 80 MG PO CHEW: 80.0000 mg | CHEWABLE_TABLET | ORAL | Status: DC | PRN

## 2019-02-07 MED ORDER — COCONUT OIL OIL
1.0000 "application " | TOPICAL_OIL | Status: DC | PRN
  Administered 2019-02-08: 1 via TOPICAL

## 2019-02-07 MED ORDER — ONDANSETRON HCL 4 MG PO TABS: 4.0000 mg | ORAL_TABLET | ORAL | Status: DC | PRN

## 2019-02-07 MED ORDER — BENZOCAINE-MENTHOL 20-0.5 % EX AERO
1.0000 "application " | INHALATION_SPRAY | CUTANEOUS | Status: DC | PRN
  Administered 2019-02-07 – 2019-02-09 (×2): 1 via TOPICAL
  Filled 2019-02-07 (×2): qty 56

## 2019-02-07 MED ORDER — DIBUCAINE (PERIANAL) 1 % EX OINT: 1.0000 "application " | TOPICAL_OINTMENT | CUTANEOUS | Status: DC | PRN

## 2019-02-07 MED ORDER — DIPHENHYDRAMINE HCL 25 MG PO CAPS: 25.0000 mg | ORAL_CAPSULE | Freq: Four times a day (QID) | ORAL | Status: DC | PRN

## 2019-02-07 MED ORDER — SENNOSIDES-DOCUSATE SODIUM 8.6-50 MG PO TABS
2.0000 | ORAL_TABLET | ORAL | Status: DC
  Administered 2019-02-08: 2 via ORAL
  Filled 2019-02-07 (×2): qty 2

## 2019-02-07 MED ORDER — ONDANSETRON HCL 4 MG/2ML IJ SOLN: 4.0000 mg | INTRAMUSCULAR | Status: DC | PRN

## 2019-02-07 MED ORDER — PRENATAL MULTIVITAMIN CH
1.0000 | ORAL_TABLET | Freq: Every day | ORAL | Status: DC
  Administered 2019-02-07 – 2019-02-09 (×3): 1 via ORAL
  Filled 2019-02-07 (×3): qty 1

## 2019-02-07 MED ORDER — MAGNESIUM HYDROXIDE 400 MG/5ML PO SUSP: 30.0000 mL | ORAL | Status: DC | PRN

## 2019-02-07 MED ORDER — WITCH HAZEL-GLYCERIN EX PADS: 1.0000 "application " | MEDICATED_PAD | CUTANEOUS | Status: DC | PRN

## 2019-02-07 MED ORDER — IBUPROFEN 600 MG PO TABS
600.0000 mg | ORAL_TABLET | Freq: Four times a day (QID) | ORAL | Status: DC
  Administered 2019-02-07: 600 mg via ORAL
  Filled 2019-02-07 (×5): qty 1

## 2019-02-07 NOTE — Progress Notes (Addendum)
LABOR PROGRESS NOTE  [place] I Schellenberg is a 18 y.o. G1P0 at [redacted]w[redacted]d  admitted for SOL and Cat II tracing.  Subjective: Comfortable with epidural  Objective: BP 119/66   Pulse 87   Temp 98.4 F (36.9 C) (Oral)   Resp 18   Ht 5\' 2"  (1.575 m)   Wt 91.2 kg   LMP 05/03/2018 (LMP Unknown)   SpO2 100%   BMI 36.76 kg/m  or  Vitals:   02/07/19 0031 02/07/19 0102 02/07/19 0131 02/07/19 0201  BP: 102/68 (!) 105/58 119/67 119/66  Pulse: 92 (!) 102 86 87  Resp:      Temp:      TempSrc:      SpO2:      Weight:      Height:         Dilation: (P) 10 Effacement (%): 100 Station: 0 Presentation: Vertex Exam by:: MD Dione Plover FHT: baseline rate 135, moderate varibility, +acel, one late decel Toco: q21min  Labs: Lab Results  Component Value Date   WBC 8.4 02/06/2019   HGB 11.9 (L) 02/06/2019   HCT 36.5 02/06/2019   MCV 89.2 02/06/2019   PLT 263 02/06/2019    Patient Active Problem List   Diagnosis Date Noted  . Post-dates pregnancy 02/06/2019  . GBS (group B Streptococcus carrier), +RV culture, currently pregnant 01/21/2019  . Supervision of other normal pregnancy, antepartum 09/19/2018  . Vitamin D deficiency 12/28/2014  . 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 Dysmenorrhea in adolescent 10/03/2014  . Chest pain on exertion 03/06/2013  . 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 Excessive gas 11/24/2012  . Ulcerative colitis without complications (HCC)     Assessment / Plan: 18 y.o. G1P0 at [redacted]w[redacted]d here for SOL and cat II tracing.  Labor: now fully dilated with bulging bag, recommended AROM and start pushing, patient would like to labor down for an hour and try to sleep, reasonable with primip. Will start pushing in one hour.  Fetal Wellbeing:  Cat II for single late decel but otherwise reassuring w moderate variability and accels Pain Control:  Epidural working well GBS: positive on penicillin, has received first dose Anticipated MOD:  SVD  Augustin Coupe, MD/[facility] OB  Fellow  02/07/2019, 2:21 AM

## 2019-02-07 NOTE — Discharge Summary (Signed)
Postpartum Discharge Summary    Patient Name: Robin Garrison DOB: 2000-05-23 MRN: [id number]  Date of admission: 02/06/2019 Delivering Provider: Clarnce Flock   Date of discharge: 02/09/2019  Admitting diagnosis: CTX 2 min  Intrauterine pregnancy: [redacted]w[redacted]d    Secondary diagnosis:  Active Problems:   Ul[place]ative colitis without complications ([place]   Supervision of other normal pregnancy, antepartum   GBS (group B Streptococcus carrier), +RV culture, currently pregnant   Post-dates pregnancy  Additional problems:      Discharge diagnosis: Term Pregnancy Delivered                                                                                                Post partum procedures:None  Augmentation: none  Complications: None  Hospital course:  Onset of Labor With Vaginal Delivery     18y.o. yo G1P0 at 414w0das admitted4w0das admitted in Latent Labor on 02/06/2019. Patient had an uncomplicated labor course as follows: initially 2.5cm in MAU but admitted for early labor due to Cat II tracing. On arrival to the floor had progressed to 8cm and subsequently progressed spontaneously to full dilation. Membrane Rupture Time/Date: 3:31 AM ,02/07/2019   Intrapartum Procedures: Episiotomy: None [1]                                         Lacerations:  Labial [10];1st degree [2]  Patient had a delivery of a Viable infant. 02/07/2019  Information for the patient's newborn:  JaLaiklynn, Raczynskiirl EgMacao0[[id number]]Delivery Method: Vag-Spont     Pateint had an uncomplicated postpartum course.  She is ambulating, tolerating a regular diet, passing flatus, and urinating well. Patient is discharged home in stable condition on 02/09/19.  Prior to discharge also strongly encouraged to re-establish care with GI for her ulcerative colitis. During her delivery noted to have blood flecked stool. Discussed this with patient and emphasized the importance of regular follow up for this condition.  Delivery time: 3:56 AM     Magnesium Sulfate received: No BMZ received: No Rhophylac:N/A MMR:N/A Transfusion:No  Physical exam  Vitals:   02/07/19 2100 02/08/19 0635 02/08/19 1737 02/09/19 0600  BP: 123/77 105/69 103/77 127/88  Pulse: 82 73 82 72  Resp: '16 16 18 17  ' Temp: 98.5 F (36.9 C) 98.6 F (37 C) 98.6 F (37 C) 98.4 F (36.9 C)  TempSrc: Oral Oral Oral Oral  SpO2: 100% 99%  100%  Weight:      Height:       General: alert, cooperative and no distress Lochia: appropriate Uterine Fundus: firm Incision: N/A DVT Evaluation: No evidence of DVT seen on physical exam. No significant calf/ankle edema. Labs: Lab Results  Component Value Date   WBC 8.4 02/06/2019   HGB 11.9 (L) 02/06/2019   HCT 36.5 02/06/2019   MCV 89.2 02/06/2019   PLT 263 02/06/2019   CMP Latest Ref Rng & Units 05/08/2012  Glucose 70 - 99 mg/dL 122(H)  BUN 6 - 23 mg/dL 11  Creatinine 0.47 -  1.00 mg/dL 0.90  Sodium 135 - 145 mEq/L 138  Potassium 3.5 - 5.1 mEq/L 3.3(L)  Chloride 96 - 112 mEq/L 102    Discharge instruction: per After Visit Summary and "Baby and Me Booklet".  After visit meds:  Allergies as of 02/09/2019      Reactions   Lactose Intolerance (gi) Other (See Comments)   Upset stomach      Medication List    STOP taking these medications   [place] these medications   acetaminophen 325 MG tablet Commonly known as: Tylenol Take 2 tablets (650 mg total) by mouth every 4 (four) hours as needed (for pain scale < 4).   folic acid 1 MG tablet Commonly known as: FOLVITE   ibuprofen 600 MG tablet Commonly known as: ADVIL Take 1 tablet (600 mg total) by mouth every 6 (six) hours.   prenatal multivitamin Tabs tablet Take 1 tablet by mouth daily at 12 noon.       Diet: routine diet  Activity: Advance as tolerated. Pelvic rest for 6 weeks.   Outpatient follow up:6 weeks Follow up Appt: Future Appointments  Date Time Provider [place]  03/14/2019   9:15 AM Leftwich-Kirby, Kathie Dike, CNM [facility] None   Follow up Visit:   Please schedule this patient for Postpartum visit in: 6 weeks with the following provider: Any provider For C/S patients schedule nurse incision check in weeks 2 weeks: no High risk pregnancy complicated by: history of ulcerative colitis Delivery mode:  SVD Anticipated Birth Control:  OCPs, counseled on possible decrease in milk supply and importance of waiting until 6 wks PP to initiate as well as not fully effective for 1 week after initiation PP Procedures needed: none  Schedule Integrated BH visit: no    Newborn Data: Live born female  Birth Weight:  3246 grams APGAR: 9, 9  Newborn Delivery   Birth date/time: 02/07/2019 03:56:00 Delivery type: Vaginal, Spontaneous      Baby Feeding: Bottle and Breast Disposition:home with mother   02/09/2019 Clarnce Flock, MD

## 2019-02-07 NOTE — Lactation Note (Signed)
This note was copied from a baby's chart. Lactation Consultation Note  Patient Name: Robin Garrison Today's Date: 02/07/2019   P1, 69 7 hours old. Reviewed hand expression bilaterally with drops expressed and mother pleased. Attempted latching in football but baby not interested. Baby has bfx2 since birth. Feed on demand with cues.  Goal 8-12+ times per day after first 24 hrs.  Place baby STS if not cueing.  Mom made aware of O/P services, breastfeeding support groups, community resources, and our phone # for post-discharge questions.       Maternal Data    Feeding    LATCH Score                   Interventions    Lactation Tools Discussed/Used     Consult Status      Carlye Grippe 02/07/2019, 10:56 AM

## 2019-02-07 NOTE — Anesthesia Postprocedure Evaluation (Signed)
Anesthesia Post Note  Patient: Robin Garrison  Procedure(s) Performed: AN AD HOC LABOR EPIDURAL     Patient location during evaluation: Mother Baby Anesthesia Type: Epidural Level of consciousness: awake Pain management: satisfactory to patient Vital Signs Assessment: post-procedure vital signs reviewed and stable Respiratory status: spontaneous breathing Cardiovascular status: stable Anesthetic complications: no    Last Vitals:  Vitals:   02/07/19 1130 02/07/19 1512  BP: 117/75 125/76  Pulse: 88 (!) 57  Resp: 18 18  Temp: (!) 36.4 C 36.8 C  SpO2: 100%     Last Pain:  Vitals:   02/07/19 1512  TempSrc: Oral  PainSc:    Pain Goal: Patients Stated Pain Goal: 2 (02/07/19 1130)                 Casimer Lanius

## 2019-02-07 NOTE — Plan of Care (Signed)
  Problem: Coping: Goal: Level of anxiety will decrease Outcome: Progressing   Problem: Elimination: Goal: Will not experience complications related to urinary retention Outcome: Progressing   Problem: Pain Managment: Goal: General experience of comfort will improve Outcome: Progressing   Problem: Safety: Goal: Ability to remain free from injury will improve Outcome: Progressing   Problem: Skin Integrity: Goal: Risk for impaired skin integrity will decrease Outcome: Progressing   Problem: Education: Goal: Knowledge of Childbirth will improve Outcome: Progressing Goal: Ability to make informed decisions regarding treatment and plan of care will improve Outcome: Progressing   Problem: Coping: Goal: Ability to verbalize concerns and feelings about labor and delivery will improve Outcome: Progressing   Problem: Life Cycle: Goal: Ability to make normal progression through stages of labor will improve Outcome: Progressing

## 2019-02-08 MED ORDER — OXYCODONE HCL 5 MG PO TABS
5.0000 mg | ORAL_TABLET | ORAL | Status: DC | PRN
  Administered 2019-02-09 (×3): 5 mg via ORAL
  Filled 2019-02-08 (×3): qty 1

## 2019-02-08 NOTE — Lactation Note (Signed)
This note was copied from a baby's chart. Lactation Consultation Note  Patient Name: Robin Garrison JJHER'D Date: 02/08/2019 Reason for consult: Follow-up assessment Baby is 35 hours old/5% weight loss.  Mom is not putting baby to breast due to sore nipples.  She may be ready to try later today.  Nipples intact.  Baby is slow to bottle feed. RN assisting with bottle feeding.  Explained importance of pumping with symphony pump to establishing milk supply.  Pump set up at bedside.and instructed on use.  Mom is eating and will pump after she is done.  Instructed to pump every 3 hours.  Encouraged to call for assist prn.  Maternal Data    Feeding Feeding Type: Bottle Fed - Formula Nipple Type: Extra Slow Flow  LATCH Score                   Interventions    Lactation Tools Discussed/Used Initiated by:: [email or web address] Date initiated:: 02/08/19   Consult Status Consult Status: Follow-up Date: 02/09/19 Follow-up type: In-patient    Ave Filter 02/08/2019, 3:04 PM

## 2019-02-08 NOTE — Progress Notes (Signed)
POSTPARTUM PROGRESS NOTE  PPD #01  Subjective:  Robin Garrison is a 18 y.o. G1P1001 s/pSVD [redacted]w[redacted]d.  She reports she doing well. No acute events overnight. She denies any problems with ambulating, voiding or po intake. Denies nausea or vomiting. She has passed flatus. Pain is well controlled. No hematuria, diarrhea or constipation. She has had two bowel movements this morning.  Objective: Blood pressure 105/69, pulse 73, temperature 98.6 F (37 C), temperature source Oral, resp. rate 16, height 5\' 2"  (1.575 m), weight 91.2 kg, last menstrual period 05/03/2018, SpO2 99 %, unknown if currently breastfeeding.  Physical Exam:  General: alert, cooperative and no distress Chest: no respiratory distress Heart:regular rate, distal pulses intact Abdomen: soft, nontender,  Uterine Fundus: firm, appropriately tender DVT Evaluation: No calf swelling or tenderness Extremities: Slight pedal edema Skin: warm, dry; good cap refill  Recent Labs    02/06/19 2055  HGB 11.9*  HCT 36.5    Assessment/Plan: Robin Garrison is a 18 y.o. G1P1001 s/p SVD at [redacted]w[redacted]d.  POD#01 - Doing welll; pain controlled.   Routine postpartum care   Contraception: Oral Progestin-only pills Feeding: Breast and bottle  Dispo: Plan for discharge tomorrow morning.   LOS: 2 days   Sherrian Divers MS3 02/08/2019, 7:53 AM   GME ATTESTATION:  I saw and evaluated the patient. I agree with the findings and the plan of care as documented in the student's note.  Merilyn Baba, DO OB Fellow, Faculty Practice 02/08/2019 12:36 PM

## 2019-02-09 ENCOUNTER — Encounter: Payer: Medicaid Other | Admitting: Obstetrics

## 2019-02-09 MED ORDER — ACETAMINOPHEN 325 MG PO TABS: 650.0000 mg | ORAL_TABLET | ORAL | 0 refills | Status: DC | PRN

## 2019-02-09 MED ORDER — IBUPROFEN 600 MG PO TABS: 600.0000 mg | ORAL_TABLET | Freq: Four times a day (QID) | ORAL | 0 refills | Status: DC

## 2019-02-09 MED ORDER — POLYETHYLENE GLYCOL 3350 17 GM/SCOOP PO POWD: 1.0000 | Freq: Once | ORAL | 0 refills | Status: AC

## 2019-02-09 NOTE — Discharge Instructions (Signed)

## 2019-02-09 NOTE — Progress Notes (Signed)
Pt declines PO Ibuprofen due to problems with her stomach in the past/pt states she is unable to tolerate. Pt states PO Tylenol has not been decreasing her pain level. Dr. Dione Plover notified and orders received.

## 2019-02-09 NOTE — Lactation Note (Signed)
This note was copied from a baby's chart. Lactation Consultation Note  Patient Name: Robin Garrison SHFWY'O Date: 02/09/2019 Reason for consult: Follow-up assessment  P1 mother whose infant is now 20 hours old.    Mother's feeding preference is breast/bottle.  Mother has not been putting baby to the breast due to sore nipples.  I questioned her on the last time she pumped and she stated she has not pumped at all yet today.  Mother's nipples "feel better" today since she has been using coconut oil and I suggested she latch baby first at the next feeding prior to giving any supplementation.  Also explained the importance of pumping every three hours, especially since baby has not been consistently pumping and suggested she post pump for 15 minutes immediately after latching if baby does not feed well.  Mother verbalized understanding.     Engorgement prevention/treatment reviewed.  Manual pump with instructions for use provided.  #24 flange size is appropriate at this time.  Mother has a DEBP for home use.  Mother has our OP phone number for concerns after discharge.  Provided information regarding our OP support groups.   Maternal Data    Feeding Feeding Type: Bottle Fed - Formula Nipple Type: Extra Slow Flow  LATCH Score                   Interventions    Lactation Tools Discussed/Used     Consult Status Consult Status: Complete Date: 02/09/19 Follow-up type: Call as needed    Robin Garrison Robin Garrison 02/09/2019, 12:10 PM

## 2019-02-14 ENCOUNTER — Inpatient Hospital Stay (HOSPITAL_COMMUNITY): Payer: Medicaid Other

## 2019-02-14 ENCOUNTER — Inpatient Hospital Stay (HOSPITAL_COMMUNITY): Admission: AD | Admit: 2019-02-14 | Payer: Medicaid Other | Source: Ambulatory Visit | Admitting: Family Medicine

## 2019-03-14 ENCOUNTER — Ambulatory Visit: Payer: Medicaid Other | Admitting: Advanced Practice Midwife

## 2019-04-04 ENCOUNTER — Telehealth (INDEPENDENT_AMBULATORY_CARE_PROVIDER_SITE_OTHER): Payer: Medicaid Other | Admitting: Certified Nurse Midwife

## 2019-04-04 DIAGNOSIS — Z1389 Encounter for screening for other disorder: Secondary | ICD-10-CM | POA: Diagnosis not present

## 2019-04-04 DIAGNOSIS — K5190 Ulcerative colitis, unspecified, without complications: Secondary | ICD-10-CM

## 2019-04-04 DIAGNOSIS — Z3009 Encounter for other general counseling and advice on contraception: Secondary | ICD-10-CM

## 2019-04-04 NOTE — Progress Notes (Signed)
TELEHEALTH POSTPARTUM VIRTUAL VIDEO VISIT ENCOUNTER NOTE   Provider location: Center for [employer] at [place]   I connected with Robin Garrison on 04/04/19 at  8:45 AM EST by MyChart Video Encounter at home and verified that I am speaking with the correct person using two identifiers.    I discussed the limitations, risks, security and privacy concerns of performing an evaluation and management service virtually and the availability of in person appointments. I also discussed with the patient that there may be a patient responsible charge related to this service. The patient expressed understanding and agreed to proceed.  Chief Complaint: Postpartum Visit  History of Present Illness: Robin Garrison is a 18 y.o. African-American G1P1001 being evaluated for postpartum followup.    She is s/p normal spontaneous vaginal delivery on 02/07/19 at 40 weeks; she was discharged to home on 02/09/19. She is 8 weeks PP. Pregnancy complicated by Ulcerative colitis . Baby is doing well.  Complains of flairing of her UC -currently not seeing a gastroenterologist - reports her insurance runs out at the end of the month and she would like to see someone to start management.  Vaginal bleeding or discharge: No  Intercourse: Yes without condom Contraception: wants oral contraceptives (estrogen/progesterone) Mode of feeding infant: Bottle PP depression s/s: No . EPDS score: 3 Any bowel or bladder issues: No  Pap smear: N/A   Review of Systems: Her 12 point review of systems is negative or as noted in the History of Present Illness.  Patient Active Problem List   Diagnosis Date Noted  . Post-dates pregnancy 02/06/2019  . GBS (group B Streptococcus carrier), +RV culture, currently pregnant 01/21/2019  . Supervision of other normal pregnancy, antepartum 09/19/2018  . Vitamin D deficiency 12/28/2014  . 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 Dysmenorrhea in adolescent 10/03/2014  .  Chest pain on exertion 03/06/2013  . 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 Excessive gas 11/24/2012  . Ulcerative colitis without complications ([place])     Medications Robin Garrison had no medications administered during this visit. Current Outpatient Medications  Medication Sig Dispense Refill  . Prenatal Vit-Fe Fumarate-FA (PRENATAL MULTIVITAMIN) TABS tablet Take 1 tablet by mouth daily at 12 noon.    Marland Kitchen acetaminophen (TYLENOL) 325 MG tablet Take 2 tablets (650 mg total) by mouth every 4 (four) hours as needed (for pain scale < 4). (Patient not taking: Reported on 04/04/2019) 30 tablet 0  . folic acid (FOLVITE) 1 MG tablet   2  . ibuprofen (ADVIL) 600 MG tablet Take 1 tablet (600 mg total) by mouth every 6 (six) hours. (Patient not taking: Reported on 04/04/2019) 30 tablet 0   No current facility-administered medications for this visit.    Allergies Lactose intolerance (gi)  Physical Exam:  LMP 05/03/2018 (LMP Unknown)   General:  Alert, oriented and cooperative. Patient is in no acute distress.  Mental Status: Normal mood and affect. Normal behavior. Normal judgment and thought content.   Respiratory: Normal respiratory effort noted, no problems with respiration noted  Rest of physical exam deferred due to type of encounter  PP Depression Screening:   Edinburgh Postnatal Depression Scale Screening Tool 04/04/2019 02/07/2019  I have been able to laugh and see the funny side of things. 0 (No Data)  I have looked forward with enjoyment to things. 0 -  I have blamed myself unnecessarily when things went wrong. 1 -  I have been anxious or worried for no good reason. 2 -  I have felt scared or panicky for no good reason. 0 -  Things have been getting on top of me. 0 -  I have been so unhappy that I have had difficulty sleeping. 0 -  I have felt sad or miserable. 0 -  I have been so unhappy that I have been crying. 0 -  The thought of harming myself has occurred to me. 0 -    Edinburgh Postnatal Depression Scale Total 3 -     Assessment:Patient is a 18 y.o. G1P1001 who is 8 weeks postpartum from a normal spontaneous vaginal delivery.  She is doing well.   Plan: 1. Postpartum care and examination - Normal postpartum examination  - No pap needed until June 2021   2. Ulcerative colitis without complications, unspecified location [facility]) - Referral for management of UC sent  - Ambulatory referral to Gastroenterology  3. Birth control counseling - Educated and discussed birth control options in detail with patient  - Patient was taking LO/OVRAL prior to pregnancy with no complaints or concerns - would like to restart those.  - Recently has unprotected IC last week, unable to prescribe medication today  - Nurse visit scheduled for 12/28 for pregnancy test- will prescribe LO/OVRAL OCPs once test negative- patient verbalizes understanding   I discussed the assessment and treatment plan with the patient. The patient was provided an opportunity to ask questions and all were answered. The patient agreed with the plan and demonstrated an understanding of the instructions.   The patient was advised to call back or seek an in-person evaluation/go to the ED for any concerning postpartum symptoms.  I provided 11 minutes of face-to-face time during this encounter.   Lajean Manes, Chancellor for [employer], [place]

## 2019-04-17 ENCOUNTER — Other Ambulatory Visit: Payer: Self-pay

## 2019-04-17 ENCOUNTER — Ambulatory Visit (INDEPENDENT_AMBULATORY_CARE_PROVIDER_SITE_OTHER): Payer: Medicaid Other | Admitting: *Deleted

## 2019-04-17 DIAGNOSIS — Z3202 Encounter for pregnancy test, result negative: Secondary | ICD-10-CM | POA: Diagnosis not present

## 2019-04-17 DIAGNOSIS — Z30011 Encounter for initial prescription of contraceptive pills: Secondary | ICD-10-CM

## 2019-04-17 MED ORDER — NORGESTREL-ETHINYL ESTRADIOL 0.3-30 MG-MCG PO TABS: 1.0000 | ORAL_TABLET | Freq: Every day | ORAL | 11 refills | Status: DC

## 2019-04-17 NOTE — Progress Notes (Signed)
Pt is in office for UPT to start on BC pills. Pt states she has not had intercourse since last visit with provider. Pt UPT in office today is Negative.   Pt V.Rogers note, Lo/Ovral to be sent in today for pt to start.  Pt made aware she may start pack today or wait for Sunday start. Pt advised to use back up method for first pill pack. Pt advised to contact office with any questions/concerns.    Meds ordered this encounter  Medications  . norgestrel-ethinyl estradiol (LO/OVRAL) 0.3-30 MG-MCG tablet    Sig: Take 1 tablet by mouth daily.    Dispense:  1 Package    Refill:  11

## 2019-04-19 ENCOUNTER — Emergency Department (HOSPITAL_COMMUNITY)
Admission: EM | Admit: 2019-04-19 | Discharge: 2019-04-19 | Disposition: A | Discharge status: Home / Self Care | Payer: Medicaid Other | Attending: Emergency Medicine | Admitting: Emergency Medicine

## 2019-04-19 ENCOUNTER — Encounter (HOSPITAL_COMMUNITY): Payer: Self-pay | Admitting: Emergency Medicine

## 2019-04-19 ENCOUNTER — Emergency Department (HOSPITAL_COMMUNITY): Payer: Medicaid Other

## 2019-04-19 ENCOUNTER — Other Ambulatory Visit: Payer: Self-pay

## 2019-04-19 DIAGNOSIS — Z79899 Other long term (current) drug therapy: Secondary | ICD-10-CM | POA: Diagnosis not present

## 2019-04-19 DIAGNOSIS — R197 Diarrhea, unspecified: Secondary | ICD-10-CM | POA: Diagnosis not present

## 2019-04-19 DIAGNOSIS — K51011 Ulcerative (chronic) pancolitis with rectal bleeding: Secondary | ICD-10-CM | POA: Diagnosis not present

## 2019-04-19 DIAGNOSIS — K625 Hemorrhage of anus and rectum: Secondary | ICD-10-CM | POA: Diagnosis present

## 2019-04-19 LAB — URINALYSIS, ROUTINE W REFLEX MICROSCOPIC
Bilirubin Urine: NEGATIVE
Glucose, UA: NEGATIVE mg/dL
Hgb urine dipstick: NEGATIVE
Ketones, ur: 5 mg/dL — AB
Nitrite: NEGATIVE
Protein, ur: NEGATIVE mg/dL
Specific Gravity, Urine: 1.017 (ref 1.005–1.030)
pH: 6 (ref 5.0–8.0)

## 2019-04-19 LAB — CBC WITH DIFFERENTIAL/PLATELET
Abs Immature Granulocytes: 0.03 10*3/uL (ref 0.00–0.07)
Basophils Absolute: 0.1 10*3/uL (ref 0.0–0.1)
Basophils Relative: 1 %
Eosinophils Absolute: 0.3 10*3/uL (ref 0.0–0.5)
Eosinophils Relative: 5 %
HCT: 38.1 % (ref 36.0–46.0)
Hemoglobin: 12 g/dL (ref 12.0–15.0)
Immature Granulocytes: 1 %
Lymphocytes Relative: 21 %
Lymphs Abs: 1.2 10*3/uL (ref 0.7–4.0)
MCH: 29.3 pg (ref 26.0–34.0)
MCHC: 31.5 g/dL (ref 30.0–36.0)
MCV: 93.2 fL (ref 80.0–100.0)
Monocytes Absolute: 0.6 10*3/uL (ref 0.1–1.0)
Monocytes Relative: 10 %
Neutro Abs: 3.7 10*3/uL (ref 1.7–7.7)
Neutrophils Relative %: 62 %
Platelets: 440 10*3/uL — ABNORMAL HIGH (ref 150–400)
RBC: 4.09 MIL/uL (ref 3.87–5.11)
RDW: 14.1 % (ref 11.5–15.5)
WBC: 5.9 10*3/uL (ref 4.0–10.5)
nRBC: 0 % (ref 0.0–0.2)

## 2019-04-19 LAB — COMPREHENSIVE METABOLIC PANEL
ALT: 20 U/L (ref 0–44)
AST: 18 U/L (ref 15–41)
Albumin: 4 g/dL (ref 3.5–5.0)
Alkaline Phosphatase: 49 U/L (ref 38–126)
Anion gap: 10 (ref 5–15)
BUN: 6 mg/dL (ref 6–20)
CO2: 28 mmol/L (ref 22–32)
Calcium: 9.4 mg/dL (ref 8.9–10.3)
Chloride: 100 mmol/L (ref 98–111)
Creatinine, Ser: 0.73 mg/dL (ref 0.44–1.00)
GFR calc Af Amer: >60 mL/min (ref >60)
GFR calc non Af Amer: >60 mL/min (ref >60)
Glucose, Bld: 92 mg/dL (ref 70–99)
Potassium: 3.5 mmol/L (ref 3.5–5.1)
Sodium: 138 mmol/L (ref 135–145)
Total Bilirubin: 0.8 mg/dL (ref 0.3–1.2)
Total Protein: 7.3 g/dL (ref 6.5–8.1)

## 2019-04-19 LAB — PROTIME-INR
INR: 1 (ref 0.8–1.2)
Prothrombin Time: 13.5 seconds (ref 11.4–15.2)

## 2019-04-19 LAB — I-STAT BETA HCG BLOOD, ED (MC, WL, AP ONLY): I-stat hCG, quantitative: <5 m[IU]/mL (ref <5)

## 2019-04-19 LAB — C-REACTIVE PROTEIN: CRP: 3.8 mg/dL — ABNORMAL HIGH (ref <1.0)

## 2019-04-19 LAB — POC OCCULT BLOOD, ED: Fecal Occult Bld: POSITIVE — AB

## 2019-04-19 LAB — SEDIMENTATION RATE: Sed Rate: 27 mm/hr — ABNORMAL HIGH (ref 0–22)

## 2019-04-19 LAB — LIPASE, BLOOD: Lipase: 29 U/L (ref 11–51)

## 2019-04-19 MED ORDER — SODIUM CHLORIDE 0.9 % IV SOLN: Freq: Once | INTRAVENOUS | Status: AC

## 2019-04-19 MED ORDER — SODIUM CHLORIDE (PF) 0.9 % IJ SOLN
INTRAMUSCULAR | Status: AC
  Filled 2019-04-19: qty 50

## 2019-04-19 MED ORDER — IOHEXOL 300 MG/ML  SOLN
100.0000 mL | Freq: Once | INTRAMUSCULAR | Status: AC | PRN
  Administered 2019-04-19: 16:00:00 100 mL via INTRAVENOUS

## 2019-04-19 MED ORDER — SULFASALAZINE POWD: 1000.0000 mg | Freq: Four times a day (QID) | 0 refills | Status: DC

## 2019-04-19 MED ORDER — MESALAMINE 1000 MG RE SUPP: 1000.0000 mg | Freq: Every day | RECTAL | 1 refills | Status: DC

## 2019-04-19 NOTE — ED Provider Notes (Signed)
[facility] DEPT Provider Note   CSN: [id number] Arrival date & time: 04/19/19  1113     History Chief Complaint  Patient presents with  . Rectal Bleeding    Robin Garrison is a 18 y.o. female.  HPI Patient has history of ulcerative colitis.  She reports has had that diagnosis for 10 years.  She used to be treated with mesalamine and sulfasalazine.  She reports she went into remission with her pregnancy this year.  It was much better from January until October.  She reports after her baby delivered, she started getting rectal bleeding and frequent bowel movements again.  She reports that is continued to really escalate such that now she has to get up multiple times in the night to have bowel movements and does not get much sleep.  She reports the bowel movements look like they have "shredded red blood" in them.  She reports intermittently she gets some vomiting.  She denies significant abdominal pain.  No documented fevers.  She reports she does get night sweats.  Patient has not been on any antibiotics for a long time.  She used to be seen by [facility] pediatric gastroenterology.  Patient moved to [place] for about a year and now has moved back recently.  She has not had follow-up since that time and is not currently on any UC related medications.    Past Medical History:  Diagnosis Date  . Chest pain 03/06/2013   Saw [facility] cardiolgy for CP on exertion in cold weather; cleared for sports, no restrictions  . Cholecystitis   . Colitis   . Proctitis   . Proctitis     Patient Active Problem List   Diagnosis Date Noted  . Post-dates pregnancy 02/06/2019  . GBS (group B Streptococcus carrier), +RV culture, currently pregnant 01/21/2019  . Supervision of other normal pregnancy, antepartum 09/19/2018  . Vitamin D deficiency 12/28/2014  . 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 Dysmenorrhea in adolescent 10/03/2014  . Chest pain on  exertion 03/06/2013  . 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 Excessive gas 11/24/2012  . Ulcerative colitis without complications [facility])     Past Surgical History:  Procedure Laterality Date  . TONSILECTOMY/ADENOIDECTOMY WITH MYRINGOTOMY       OB History    Gravida  1   Para  1   Term  1   Preterm      AB      Living  1     SAB      TAB      Ectopic      Multiple  0   Live Births  1           Family History  Problem Relation Age of Onset  . Hypertension Mother   . Healthy Father   . Diabetes Other   . Diabetes Paternal Aunt   . Vision loss Paternal Grandmother   . Inflammatory bowel disease Neg Hx     Social History   Tobacco Use  . Smoking status: Never Smoker  . Smokeless tobacco: Never Used  Substance Use Topics  . Alcohol use: No    Alcohol/week: 0.0 standard drinks  . Drug use: No    Home Medications Prior to Admission medications   Medication Sig Start Date End Date Taking? Authorizing Provider  Multiple Vitamins-Minerals (HAIR SKIN AND NAILS FORMULA PO) Take 2 capsules by mouth daily.   Yes [provider]  Prenatal Vit-Fe Fumarate-FA (PRENATAL MULTIVITAMIN)  TABS tablet Take 1 tablet by mouth daily.    Yes [provider]  acetaminophen (TYLENOL) 325 MG tablet Take 2 tablets (650 mg total) by mouth every 4 (four) hours as needed (for pain scale < 4). Patient not taking: Reported on 04/04/2019 02/09/19   Clarnce Flock, MD  ibuprofen (ADVIL) 600 MG tablet Take 1 tablet (600 mg total) by mouth every 6 (six) hours. Patient not taking: Reported on 04/04/2019 02/09/19   Clarnce Flock, MD  mesalamine (CANASA) 1000 MG suppository Place 1 suppository (1,000 mg total) rectally at bedtime. 04/19/19   Charlesetta Shanks, MD  norgestrel-ethinyl estradiol (LO/OVRAL) 0.3-30 MG-MCG tablet Take 1 tablet by mouth daily. Patient not taking: Reported on 04/19/2019 04/17/19   Lajean Manes, CNM  Sulfasalazine POWD 1,000 mg by Does  not apply route 4 (four) times daily. 04/19/19   Charlesetta Shanks, MD    Allergies    Lactose intolerance (gi)  Review of Systems   Review of Systems 10 Systems reviewed and are negative for acute change except as noted in the HPI.  Physical Exam Updated Vital Signs BP 118/77 (BP Location: Right Arm)   Pulse 72   Temp 98.5 F (36.9 C) (Oral)   Resp 18   LMP 04/10/2019 (Approximate)   SpO2 98%   Physical Exam Constitutional:      Appearance: Normal appearance.     Comments: Patient is clinically well in appearance.  She is alert and nontoxic.  Well-nourished and well-developed.  HENT:     Head: Normocephalic and atraumatic.  Eyes:     Extraocular Movements: Extraocular movements intact.  Cardiovascular:     Rate and Rhythm: Normal rate and regular rhythm.  Pulmonary:     Effort: Pulmonary effort is normal.     Breath sounds: Normal breath sounds.  Abdominal:     General: There is no distension.     Palpations: Abdomen is soft.     Tenderness: There is no abdominal tenderness. There is no guarding.  Genitourinary:    Comments: Rectal, trace pink mucus in the rectal vault. Musculoskeletal:        General: No swelling or tenderness. Normal range of motion.  Skin:    General: Skin is warm and dry.  Neurological:     General: No focal deficit present.     Mental Status: She is alert and oriented to person, place, and time.     Coordination: Coordination normal.  Psychiatric:        Mood and Affect: Mood normal.     ED Results / Procedures / Treatments   Labs (all labs ordered are listed, but only abnormal results are displayed) Labs Reviewed  CBC WITH DIFFERENTIAL/PLATELET - Abnormal; Notable for the following components:      Result Value   Platelets 440 (*)    All other components within normal limits  URINALYSIS, ROUTINE W REFLEX MICROSCOPIC - Abnormal; Notable for the following components:   APPearance HAZY (*)    Ketones, ur 5 (*)    Leukocytes,Ua TRACE  (*)    Bacteria, UA RARE (*)    All other components within normal limits  C-REACTIVE PROTEIN - Abnormal; Notable for the following components:   CRP 3.8 (*)    All other components within normal limits  SEDIMENTATION RATE - Abnormal; Notable for the following components:   Sed Rate 27 (*)    All other components within normal limits  POC OCCULT BLOOD, ED - Abnormal; Notable for the  following components:   Fecal Occult Bld POSITIVE (*)    All other components within normal limits  GI PATHOGEN PANEL BY PCR, STOOL  C DIFFICILE QUICK SCREEN W PCR REFLEX  COMPREHENSIVE METABOLIC PANEL  LIPASE, BLOOD  PROTIME-INR  I-STAT BETA HCG BLOOD, ED ([facility], [facility], [facility] ONLY)    EKG None  Radiology CT Abdomen Pelvis W Contrast  Result Date: 04/19/2019 CLINICAL DATA:  History of ulcerative colitis. Rectal bleeding and diarrhea EXAM: CT ABDOMEN AND PELVIS WITH CONTRAST TECHNIQUE: Multidetector CT imaging of the abdomen and pelvis was performed using the standard protocol following bolus administration of intravenous contrast. CONTRAST:  163m OMNIPAQUE IOHEXOL 300 MG/ML  SOLN COMPARISON:  None. FINDINGS: Lower chest: No acute abnormality. Hepatobiliary: Lesion within segment 7 is identified measuring 1.7 cm, image 11/2. This contains peripheral areas of nodular enhancement favoring a benign hemangioma. Gallbladder appears normal. No biliary ductal dilatation. Pancreas: Unremarkable. No pancreatic ductal dilatation or surrounding inflammatory changes. Spleen: Normal in size without focal abnormality. Adrenals/Urinary Tract: Normal adrenal glands. The kidneys are unremarkable. No mass or hydronephrosis. Urinary bladder appears normal for degree of distention. Stomach/Bowel: Stomach is nondistended. Small bowel loops have a normal caliber. No small bowel wall thickening, inflammation or distension. There is diffuse colonic wall edema, mucosal enhancement and Peri colonic hyperemia compatible with pancolitis. There  is no pneumatosis, evidence of bowel perforation, or bowel distension. Vascular/Lymphatic: No significant vascular findings are present. No enlarged abdominal or pelvic lymph nodes. Reproductive: Uterus and bilateral adnexa are unremarkable. Other: No free fluid, or fluid collections. Musculoskeletal: No acute or significant osseous findings. IMPRESSION: 1. Findings compatible with pancolitis. No evidence for bowel perforation, abscess or bowel obstruction. 2. Solitary liver lesion is identified favoring benign hemangioma. Advise follow-up imaging with abdominal sonogram in 6 months. Electronically Signed   By: TKerby MoorsM.D.   On: 04/19/2019 16:08    Procedures Procedures (including critical care time)  Medications Ordered in ED Medications  sodium chloride (PF) 0.9 % injection (has no administration in time range)  0.9 %  sodium chloride infusion ( Intravenous Bolus from Bag 04/19/19 1207)  iohexol (OMNIPAQUE) 300 MG/ML solution 100 mL (100 mLs Intravenous Contrast Given 04/19/19 1540)    ED Course  I have reviewed the triage vital signs and the nursing notes.  Pertinent labs & imaging results that were available during my care of the patient were reviewed by me and considered in my medical decision making (see chart for details).    MDM Rules/Calculators/A&P                      Patient has known history of ulcerative colitis.  Symptoms have rebounded since her pregnancy.  Patient is not breast-feeding.  CT scan shows pancolitis without abscess or perforation.  Patient is clinically well in appearance.  Her abdomen is soft without guarding.  No fever, normal vital signs and no leukocytosis or anemia.  At this time, will reinstitute sulfasalazine and mesalamine suppositories.  Patient has undergone these therapies in the past.  [facility] and [facility]  Strict return precautions reviewed. Final Clinical Impression(s) / ED Diagnoses Final diagnoses:  Ulcerative pancolitis with rectal bleeding ([place]    Rx / DC Orders ED Discharge Orders         Ordered    mesalamine (CANASA) 1000 MG suppository  Daily at  bedtime     04/19/19 1652    Sulfasalazine POWD  4 times daily     04/19/19 1652           Charlesetta Shanks, MD 04/19/19 1724

## 2019-04-19 NOTE — ED Triage Notes (Signed)
Patient reports hx of ulcerative colitis. States diarrhea and rectal bleeding since delivering baby in October.

## 2019-04-19 NOTE — ED Notes (Signed)
Spoke with mother and reviewed discharge instructions with mother and pt. Pt and mother verbalized understanding and have no other questions at this time. Pt ambulatory to bathroom.

## 2019-04-19 NOTE — Discharge Instructions (Signed)
1.  You are having a flareup of your ulcerative colitis.  It is very important you get follow-up with a gastroenterologist as soon as possible.  Call your previous provider with [facility] health to see if you can be seen or get a referral.  Also included in your discharge instructions is the contact information for [facility] gastroenterology, this is the group on-call for the [facility] health care system.  If you plan on changing providers, call [facility]. 2. You have been given prescriptions to restart treatment for ulcerative colitis.  Fill these prescriptions and start them today.  Return to the emergency department if you develop fever, pain, worsening symptoms.

## 2019-04-19 NOTE — ED Notes (Signed)
Pt transported to CT ?

## 2019-05-04 ENCOUNTER — Encounter: Payer: Self-pay | Admitting: Gastroenterology

## 2019-05-30 ENCOUNTER — Ambulatory Visit: Payer: Medicaid Other | Admitting: Gastroenterology

## 2019-07-13 ENCOUNTER — Ambulatory Visit: Payer: Medicaid Other | Admitting: Gastroenterology

## 2019-09-05 ENCOUNTER — Emergency Department (HOSPITAL_COMMUNITY)
Admission: EM | Admit: 2019-09-05 | Discharge: 2019-09-05 | Disposition: A | Discharge status: Home / Self Care | Payer: Medicaid Other | Attending: Emergency Medicine | Admitting: Emergency Medicine

## 2019-09-05 ENCOUNTER — Encounter (HOSPITAL_COMMUNITY): Payer: Self-pay

## 2019-09-05 ENCOUNTER — Other Ambulatory Visit: Payer: Self-pay

## 2019-09-05 DIAGNOSIS — R21 Rash and other nonspecific skin eruption: Secondary | ICD-10-CM | POA: Diagnosis not present

## 2019-09-05 MED ORDER — PREDNISONE 20 MG PO TABS: 20.0000 mg | ORAL_TABLET | Freq: Every day | ORAL | 0 refills | Status: AC

## 2019-09-05 MED ORDER — MUPIROCIN CALCIUM 2 % NA OINT: TOPICAL_OINTMENT | NASAL | 0 refills | Status: DC

## 2019-09-05 NOTE — ED Provider Notes (Signed)
[facility] DEPT Provider Note   CSN: [id number] Arrival date & time: 09/05/19  1138     History Chief Complaint  Patient presents with  . Rash    Robin Garrison is a 19 y.o. female.  HPI   19 year old female presenting for evaluation of a rash to her face.  Patient states that she used some tea tree oil to her face and later developed a red, itchy rash to her face.  She has tried using some acne cream without any significant relief.  The rash has been constant since onset as has worsened since onset.  She has no associated fevers or other systemic symptoms.  States she had a similar reaction when using tea tree oil in the past but she forgot that she had this reaction to it.  Past Medical History:  Diagnosis Date  . Chest pain 03/06/2013   Saw [facility] for CP on exertion in cold weather; cleared for sports, no restrictions  . Cholecystitis   . Colitis   . Proctitis   . Proctitis     Patient Active Problem List   Diagnosis Date Noted  . Post-dates pregnancy 02/06/2019  . GBS (group B Streptococcus carrier), +RV culture, currently pregnant 01/21/2019  . Supervision of other normal pregnancy, antepartum 09/19/2018  . Vitamin D deficiency 12/28/2014  . 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 Dysmenorrhea in adolescent 10/03/2014  . Chest pain on exertion 03/06/2013  . 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 Excessive gas 11/24/2012  . Ulcerative colitis without complications [facility])     Past Surgical History:  Procedure Laterality Date  . TONSILECTOMY/ADENOIDECTOMY WITH MYRINGOTOMY       OB History    Gravida  1   Para  1   Term  1   Preterm      AB      Living  1     SAB      TAB      Ectopic      Multiple  0   Live Births  1           Family History  Problem Relation Age of Onset  . Hypertension Mother   . Healthy Father   . Diabetes Other   . Diabetes Paternal Aunt   . Vision loss Paternal  Grandmother   . Inflammatory bowel disease Neg Hx     Social History   Tobacco Use  . Smoking status: Never Smoker  . Smokeless tobacco: Never Used  Substance Use Topics  . Alcohol use: No    Alcohol/week: 0.0 standard drinks  . Drug use: No    Home Medications Prior to Admission medications   Medication Sig Start Date End Date Taking? Authorizing Provider  acetaminophen (TYLENOL) 325 MG tablet Take 2 tablets (650 mg total) by mouth every 4 (four) hours as needed (for pain scale < 4). Patient not taking: Reported on 04/04/2019 02/09/19   Clarnce Flock, MD  ibuprofen (ADVIL) 600 MG tablet Take 1 tablet (600 mg total) by mouth every 6 (six) hours. Patient not taking: Reported on 04/04/2019 02/09/19   Clarnce Flock, MD  mesalamine (CANASA) 1000 MG suppository Place 1 suppository (1,000 mg total) rectally at bedtime. 04/19/19   Charlesetta Shanks, MD  Multiple Vitamins-Minerals (HAIR SKIN AND NAILS FORMULA PO) Take 2 capsules by mouth daily.    [provider]  mupirocin nasal ointment (BACTROBAN) 2 % Apply to the face twice daily for 1 week 09/05/19  Couture, Cortni S, PA-C  norgestrel-ethinyl estradiol (LO/OVRAL) 0.3-30 MG-MCG tablet Take 1 tablet by mouth daily. Patient not taking: Reported on 04/19/2019 04/17/19   Lajean Manes, CNM  predniSONE (DELTASONE) 20 MG tablet Take 1 tablet (20 mg total) by mouth daily for 5 days. 09/05/19 09/10/19  Couture, Cortni S, PA-C  Prenatal Vit-Fe Fumarate-FA (PRENATAL MULTIVITAMIN) TABS tablet Take 1 tablet by mouth daily.     [provider]  Sulfasalazine POWD 1,000 mg by Does not apply route 4 (four) times daily. 04/19/19   Charlesetta Shanks, MD    Allergies    Lactose intolerance (gi)  Review of Systems   Review of Systems  Constitutional: Negative for fever.  Skin: Positive for color change, rash and wound.    Physical Exam Updated Vital Signs BP 111/81 (BP Location: Right Arm)   Pulse 84   Temp 99 F  (37.2 C) (Oral)   Resp 18   Ht 5' 1"  (1.549 m)   Wt 63.5 kg   LMP 08/15/2019 (Approximate)   SpO2 100%   BMI 26.45 kg/m   Physical Exam Vitals and nursing note reviewed.  Constitutional:      General: She is not in acute distress.    Appearance: She is well-developed.  HENT:     Head: Normocephalic and atraumatic.  Eyes:     Conjunctiva/sclera: Conjunctivae normal.  Cardiovascular:     Rate and Rhythm: Normal rate.  Pulmonary:     Effort: Pulmonary effort is normal.  Musculoskeletal:        General: Normal range of motion.     Cervical back: Neck supple.  Skin:    General: Skin is warm and dry.     Comments: Erythema and skin excoriation noted just to the left of the nose  Neurological:     Mental Status: She is alert.     ED Results / Procedures / Treatments   Labs (all labs ordered are listed, but only abnormal results are displayed) Labs Reviewed - No data to display  EKG None  Radiology No results found.  Procedures Procedures (including critical care time)  Medications Ordered in ED Medications - No data to display  ED Course  I have reviewed the triage vital signs and the nursing notes.  Pertinent labs & imaging results that were available during my care of the patient were reviewed by me and considered in my medical decision making (see chart for details).    MDM Rules/Calculators/A&P                      Pt with rash to the face after using tea tree oil. Likely contact dermatitis. Patient denies any difficulty breathing or swallowing.  Pt has a patent airway without stridor and is handling secretions without difficulty; no angioedema. No blisters, no pustules, no warmth, no draining sinus tracts, no superficial abscesses, no bullous impetigo, no vesicles, no desquamation, no target lesions with dusky purpura or a central bulla. Not tender to touch. No concern for superimposed infection. No concern for SJS, TEN, TSS, tick borne illness, syphilis or  other life-threatening condition. Will discharge home with short course of steroids. Will also give some mupirocen cream. Advised on close f/u and return precautions. She voices understanding of the plan and reasons to return. All questions answered, pt stable for discharge.  Final Clinical Impression(s) / ED Diagnoses Final diagnoses:  Rash    Rx / DC Orders ED Discharge Orders  Ordered    mupirocin nasal ointment (BACTROBAN) 2 %     09/05/19 1550    predniSONE (DELTASONE) 20 MG tablet  Daily     09/05/19 Heeney, Cortni S, PA-C 09/05/19 Palisade, Adam, DO 09/05/19 1556

## 2019-09-05 NOTE — ED Triage Notes (Signed)
Pt presents with c/o rash on her face which she believes to be a reaction to tee tree oil. Pt has some cream on the area at this time.

## 2019-09-05 NOTE — Discharge Instructions (Signed)
Take the steroids as directed and use the cream as directed.   A culture was sent of your urine today to determine if there is any bacterial growth. If the results of the culture are positive and you require an antibiotic or a change of your prescribed antibiotic you will be contacted by the hospital. If the results are negative you will not be contacted.

## 2019-09-11 ENCOUNTER — Ambulatory Visit (INDEPENDENT_AMBULATORY_CARE_PROVIDER_SITE_OTHER): Payer: Medicaid Other | Admitting: Gastroenterology

## 2019-09-11 ENCOUNTER — Encounter: Payer: Self-pay | Admitting: Gastroenterology

## 2019-09-11 ENCOUNTER — Ambulatory Visit (INDEPENDENT_AMBULATORY_CARE_PROVIDER_SITE_OTHER): Payer: Medicaid Other

## 2019-09-11 ENCOUNTER — Other Ambulatory Visit: Payer: Self-pay

## 2019-09-11 ENCOUNTER — Other Ambulatory Visit (INDEPENDENT_AMBULATORY_CARE_PROVIDER_SITE_OTHER): Payer: Medicaid Other

## 2019-09-11 VITALS — BP 104/70 | HR 72 | Ht 62.0 in | Wt 142.1 lb

## 2019-09-11 DIAGNOSIS — K5900 Constipation, unspecified: Secondary | ICD-10-CM

## 2019-09-11 DIAGNOSIS — Z01818 Encounter for other preprocedural examination: Secondary | ICD-10-CM

## 2019-09-11 DIAGNOSIS — K51919 Ulcerative colitis, unspecified with unspecified complications: Secondary | ICD-10-CM

## 2019-09-11 DIAGNOSIS — Z1159 Encounter for screening for other viral diseases: Secondary | ICD-10-CM

## 2019-09-11 LAB — COMPREHENSIVE METABOLIC PANEL
ALT: 11 U/L (ref 0–35)
AST: 12 U/L (ref 0–37)
Albumin: 4.1 g/dL (ref 3.5–5.2)
Alkaline Phosphatase: 32 U/L — ABNORMAL LOW (ref 39–117)
BUN: 14 mg/dL (ref 6–23)
CO2: 26 mEq/L (ref 19–32)
Calcium: 9.3 mg/dL (ref 8.4–10.5)
Chloride: 105 mEq/L (ref 96–112)
Creatinine, Ser: 0.8 mg/dL (ref 0.40–1.20)
GFR: 109.64 mL/min (ref >60.00)
Glucose, Bld: 82 mg/dL (ref 70–99)
Potassium: 3.9 mEq/L (ref 3.5–5.1)
Sodium: 139 mEq/L (ref 135–145)
Total Bilirubin: 0.4 mg/dL (ref 0.2–1.2)
Total Protein: 7.3 g/dL (ref 6.0–8.3)

## 2019-09-11 LAB — CBC WITH DIFFERENTIAL/PLATELET
Basophils Absolute: 0.1 10*3/uL (ref 0.0–0.1)
Basophils Relative: 1 % (ref 0.0–3.0)
Eosinophils Absolute: 0.1 10*3/uL (ref 0.0–0.7)
Eosinophils Relative: 1.9 % (ref 0.0–5.0)
HCT: 31.7 % — ABNORMAL LOW (ref 36.0–46.0)
Hemoglobin: 9.8 g/dL — ABNORMAL LOW (ref 12.0–15.0)
Lymphocytes Relative: 38.9 % (ref 12.0–46.0)
Lymphs Abs: 2.3 10*3/uL (ref 0.7–4.0)
MCHC: 31 g/dL (ref 30.0–36.0)
MCV: 76.6 fl — ABNORMAL LOW (ref 78.0–100.0)
Monocytes Absolute: 0.4 10*3/uL (ref 0.1–1.0)
Monocytes Relative: 6.7 % (ref 3.0–12.0)
Neutro Abs: 3 10*3/uL (ref 1.4–7.7)
Neutrophils Relative %: 51.5 % (ref 43.0–77.0)
Platelets: 551 10*3/uL — ABNORMAL HIGH (ref 150.0–400.0)
RBC: 4.14 Mil/uL (ref 3.87–5.11)
RDW: 17.7 % — ABNORMAL HIGH (ref 11.5–14.6)
WBC: 5.9 10*3/uL (ref 4.5–10.5)

## 2019-09-11 LAB — HIGH SENSITIVITY CRP: CRP, High Sensitivity: 2.12 mg/L (ref 0.000–5.000)

## 2019-09-11 LAB — IBC PANEL
Iron: 19 ug/dL — ABNORMAL LOW (ref 42–145)
Saturation Ratios: 3.9 % — ABNORMAL LOW (ref 20.0–50.0)
Transferrin: 346 mg/dL (ref 212.0–360.0)

## 2019-09-11 LAB — FOLATE: Folate: >23.7 ng/mL (ref >5.9)

## 2019-09-11 LAB — VITAMIN B12: Vitamin B-12: 249 pg/mL (ref 211–911)

## 2019-09-11 LAB — FERRITIN: Ferritin: 3.3 ng/mL — ABNORMAL LOW (ref 10.0–291.0)

## 2019-09-11 LAB — SEDIMENTATION RATE: Sed Rate: 22 mm/hr — ABNORMAL HIGH (ref 0–20)

## 2019-09-11 MED ORDER — SUTAB 1479-225-188 MG PO TABS: 1.0000 | ORAL_TABLET | Freq: Once | ORAL | 0 refills | Status: AC

## 2019-09-11 MED ORDER — SULFASALAZINE 500 MG PO TABS: 1000.0000 mg | ORAL_TABLET | Freq: Two times a day (BID) | ORAL | 6 refills | Status: DC

## 2019-09-11 NOTE — Progress Notes (Signed)
Robin Garrison    [id number]    Nov 13, 2000  Primary Care Physician:Patient, No Pcp Per  Referring Physician: Lajean Manes, [address],  [place] [id number]   Chief complaint: Ulcerative colitis HPI:  19 year old female here for new patient visit to establish GI care for management of ulcerative colitis  She was diagnosed with ulcerative colitis around age 66.  She feels her symptoms are cyclical, has flareup once every 6 months with significant diarrhea, up to 10 bowel movements per day with watery stool and blood mixed in stool. She feels her colitis symptoms were stable during her pregnancy but postpartum she had a flareup with significant diarrhea that has resolved on its own.  Currently she is constipated with bowel movement every other day.  Denies any rectal bleeding, abdominal pain, loss of appetite or weight loss.  She is currently not on any maintenance therapy.  No family history of GI malignancy.   Outpatient Encounter Medications as of 09/11/2019  Medication Sig  . Multiple Vitamins-Minerals (HAIR SKIN AND NAILS FORMULA PO) Take 2 capsules by mouth daily.  . Norgestrel-Ethinyl Estradiol (LO/OVRAL PO) Take by mouth.  . sulfaSALAzine (AZULFIDINE) 500 MG tablet Take 500 mg by mouth 5 (five) times daily.  . [DISCONTINUED] acetaminophen (TYLENOL) 325 MG tablet Take 2 tablets (650 mg total) by mouth every 4 (four) hours as needed (for pain scale < 4). (Patient not taking: Reported on 04/04/2019)  . [DISCONTINUED] ibuprofen (ADVIL) 600 MG tablet Take 1 tablet (600 mg total) by mouth every 6 (six) hours. (Patient not taking: Reported on 04/04/2019)  . [DISCONTINUED] mesalamine (CANASA) 1000 MG suppository Place 1 suppository (1,000 mg total) rectally at bedtime.  . [DISCONTINUED] mupirocin nasal ointment (BACTROBAN) 2 % Apply to the face twice daily for 1 week  . [DISCONTINUED] norgestrel-ethinyl estradiol (LO/OVRAL) 0.3-30 MG-MCG tablet Take 1 tablet  by mouth daily. (Patient not taking: Reported on 04/19/2019)  . [DISCONTINUED] Prenatal Vit-Fe Fumarate-FA (PRENATAL MULTIVITAMIN) TABS tablet Take 1 tablet by mouth daily.   . [DISCONTINUED] Sulfasalazine POWD 1,000 mg by Does not apply route 4 (four) times daily.   No facility-administered encounter medications on file as of 09/11/2019.    Allergies as of 09/11/2019 - Review Complete 09/11/2019  Allergen Reaction Noted  . Lactose intolerance (gi) Other (See Comments) 05/08/2012    Past Medical History:  Diagnosis Date  . Chest pain 03/06/2013   Saw [facility] for CP on exertion in cold weather; cleared for sports, no restrictions  . Cholecystitis   . Colitis   . Proctitis   . Proctitis     Past Surgical History:  Procedure Laterality Date  . EYE SURGERY Bilateral    age 72  . TONSILECTOMY/ADENOIDECTOMY WITH MYRINGOTOMY      Family History  Problem Relation Age of Onset  . Hypertension Mother   . Healthy Father   . Other Maternal Grandmother        stomach problems  . Irritable bowel syndrome Maternal Grandmother   . Diabetes Other   . Diabetes Paternal Aunt   . Vision loss Paternal Grandmother   . Diabetes Paternal Grandmother   . Heart disease Maternal Grandfather   . Heart disease Maternal Uncle   . Inflammatory bowel disease Neg Hx     Social History   Socioeconomic History  . Marital status: Single    Spouse name: Not on file  . Number of children: 1  .  Years of education: Not on file  . Highest education level: Not on file  Occupational History  . Occupation: [employer]  Tobacco Use  . Smoking status: Never Smoker  . Smokeless tobacco: Never Used  Substance and Sexual Activity  . Alcohol use: No    Alcohol/week: 0.0 standard drinks  . Drug use: No  . Sexual activity: Not Currently    Partners: Male    Birth control/protection: Pill  Other Topics Concern  . Not on file  Social History Narrative   8th grade   Social Determinants of  Health   Financial Resource Strain: Low Risk   . Difficulty of Paying Living Expenses: Not hard at all  Food Insecurity: No Food Insecurity  . Worried About Charity fundraiser in the Last Year: Never true  . Ran Out of Food in the Last Year: Never true  Transportation Needs: No Transportation Needs  . Lack of Transportation (Medical): No  . Lack of Transportation (Non-Medical): No  Physical Activity: Unknown  . Days of Exercise per Week: Patient refused  . Minutes of Exercise per Session: Patient refused  Stress: No Stress Concern Present  . Feeling of Stress : Not at all  Social Connections: Unknown  . Frequency of Communication with Friends and Family: Patient refused  . Frequency of Social Gatherings with Friends and Family: Patient refused  . Attends Religious Services: Patient refused  . Active Member of Clubs or Organizations: Patient refused  . Attends Archivist Meetings: Patient refused  . Marital Status: Patient refused  Intimate Partner Violence: Not At Risk  . Fear of Current or Ex-Partner: No  . Emotionally Abused: No  . Physically Abused: No  . Sexually Abused: No      Review of systems: All other review of systems negative except as mentioned in the HPI.   Physical Exam: Vitals:   09/11/19 1013  BP: 104/70  Pulse: 72   Body mass index is 26 kg/m. Gen:      No acute distress Abd:      + bowel sounds; soft, non-tender; no palpable masses, no distension Ext:    No edema; adequate peripheral perfusion Skin:      Warm and dry; no rash Neuro: alert and oriented x 3 Psych: normal mood and affect  Data Reviewed:  Reviewed labs, radiology imaging, old records and pertinent past GI work up   Assessment and Plan/Recommendations:  19 year old female with history of ulcerative colitis initially diagnosed at age 27, intermittent acute flare currently asymptomatic  Schedule for colonoscopy to assess disease activity with inflammatory bowel disease  and the extent of the disease.  She is also due for surveillance for dysplastic lesions for colon cancer given her longstanding history of IBD The risks and benefits as well as alternatives of endoscopic procedure(s) have been discussed and reviewed. All questions answered. The patient agrees to proceed.  Check CBC, CMP, B12, folate and iron level CRP and ESR  Sulfasalazine 1 g twice daily plus folic acid 1 mg daily Advised patient to take additional prenatal multivitamin with folic acid  Start MiraLAX 1 capful daily for constipation Increase fluid intake  Return in 3 months or sooner if needed  The patient was provided an opportunity to ask questions and all were answered. The patient agreed with the plan and demonstrated an understanding of the instructions.  Damaris Hippo , MD    CC: Lajean Manes, CNM

## 2019-09-11 NOTE — Patient Instructions (Signed)
Your provider has requested that you go to the basement level for lab work before leaving today. Press "B" on the elevator. The lab is located at the first door on the left as you exit the elevator.   We have sent the following medications to your pharmacy for you to pick up at your convenience:  Sulfasalazine.  Take a prenatal vitamin with folic acid daily.  Miralax - 1 capful daily as needed.  You have been scheduled for a colonoscopy. Please follow written instructions given to you at your visit today.  Please pick up your prep supplies at the pharmacy within the next 1-3 days. If you use inhalers (even only as needed), please bring them with you on the day of your procedure. Your physician has requested that you go to www.startemmi.com and enter the access code given to you at your visit today. This web site gives a general overview about your procedure. However, you should still follow specific instructions given to you by our office regarding your preparation for the procedure.  Please follow up in 6 months

## 2019-09-12 LAB — SARS CORONAVIRUS 2 (TAT 6-24 HRS): SARS Coronavirus 2: NEGATIVE

## 2019-09-13 ENCOUNTER — Encounter: Payer: Self-pay | Admitting: Gastroenterology

## 2019-09-13 ENCOUNTER — Ambulatory Visit (AMBULATORY_SURGERY_CENTER): Payer: Medicaid Other | Admitting: Gastroenterology

## 2019-09-13 ENCOUNTER — Other Ambulatory Visit: Payer: Self-pay

## 2019-09-13 VITALS — BP 106/73 | HR 80 | Temp 97.1°F | Resp 12 | Ht 62.0 in | Wt 142.0 lb

## 2019-09-13 DIAGNOSIS — K51919 Ulcerative colitis, unspecified with unspecified complications: Secondary | ICD-10-CM | POA: Diagnosis not present

## 2019-09-13 DIAGNOSIS — K529 Noninfective gastroenteritis and colitis, unspecified: Secondary | ICD-10-CM | POA: Diagnosis not present

## 2019-09-13 MED ORDER — PREDNISONE 5 MG PO TABS
5.0000 mg | ORAL_TABLET | Freq: Every day | ORAL | 0 refills | Status: DC
Start: 2019-09-13 — End: 2019-10-11

## 2019-09-13 MED ORDER — SODIUM CHLORIDE 0.9 % IV SOLN: 500.0000 mL | INTRAVENOUS | Status: DC

## 2019-09-13 NOTE — Progress Notes (Signed)
Vs CW ° °

## 2019-09-13 NOTE — Patient Instructions (Signed)
YOU HAD AN ENDOSCOPIC PROCEDURE TODAY AT [facility]:   Refer to the procedure report that was given to you for any specific questions about what was found during the examination.  If the procedure report does not answer your questions, please call your gastroenterologist to clarify.  If you requested that your care partner not be given the details of your procedure findings, then the procedure report has been included in a sealed envelope for you to review at your convenience later.  YOU SHOULD EXPECT: Some feelings of bloating in the abdomen. Passage of more gas than usual.  Walking can help get rid of the air that was put into your GI tract during the procedure and reduce the bloating. If you had a lower endoscopy (such as a colonoscopy or flexible sigmoidoscopy) you may notice spotting of blood in your stool or on the toilet paper. If you underwent a bowel prep for your procedure, you may not have a normal bowel movement for a few days.  Please Note:  You might notice some irritation and congestion in your nose or some drainage.  This is from the oxygen used during your procedure.  There is no need for concern and it should clear up in a day or so.  SYMPTOMS TO REPORT IMMEDIATELY:   Following lower endoscopy (colonoscopy or flexible sigmoidoscopy):  Excessive amounts of blood in the stool  Significant tenderness or worsening of abdominal pains  Swelling of the abdomen that is new, acute  Fever of 100F or higher   For urgent or emergent issues, a gastroenterologist can be reached at any hour by calling [phone]. Do not use MyChart messaging for urgent concerns.    DIET:  We do recommend a small meal at first, but then you may proceed to your regular diet.  Drink plenty of fluids but you should avoid alcoholic beverages for 24 hours.  MEDICATIONS: Continue present medications. Use Prednisone 15m by mouth once a day for 2 weeks. Follow with a tapered dose; taper with  decrease in dose by 5 mg every week.  FOLLOW UP:  Follow up with Dr. NSilverio Decampin her office at the next available appointment in 4-6 weeks.  ACTIVITY:  You should plan to take it easy for the rest of today and you should NOT DRIVE or use heavy machinery until tomorrow (because of the sedation medicines used during the test).    FOLLOW UP: Our staff will call the number listed on your records 48-72 hours following your procedure to check on you and address any questions or concerns that you may have regarding the information given to you following your procedure. If we do not reach you, we will leave a message.  We will attempt to reach you two times.  During this call, we will ask if you have developed any symptoms of COVID 19. If you develop any symptoms (ie: fever, flu-like symptoms, shortness of breath, cough etc.) before then, please call ([phone]  If you test positive for Covid 19 in the 2 weeks post procedure, please call and report this information to [facility]    If any biopsies were taken you will be contacted by phone or by letter within the next 1-3 weeks.  Please call [facility] ([phone] you have not heard about the biopsies in 3 weeks.   Thank you for allowing [facility] provide for your healthcare needs today.   SIGNATURES/CONFIDENTIALITY: You and/or your care partner have signed paperwork which will  be entered into your electronic medical record.  These signatures attest to the fact that that the information above on your After Visit Summary has been reviewed and is understood.  Full responsibility of the confidentiality of this discharge information lies with you and/or your care-partner.

## 2019-09-13 NOTE — Progress Notes (Signed)
To pacu, VSS. Report to [email or web address] 

## 2019-09-13 NOTE — Op Note (Signed)
[facility] Patient Name: Robin Garrison Procedure Date: 09/13/2019 11:28 AM MRN: [id number] Endoscopist: Mauri Pole , MD Age: 19 Referring MD:  Date of Birth: 05-05-2000 Gender: Female Account #: [id number] Procedure:                Colonoscopy Indications:              High risk colon cancer surveillance: Inflammatory                            bowel disease (unclassified) of 8 (or more) years                            duration with one-third (or more) of the colon                            involved. Diagnosed with IBD at age 37. Medicines:                Monitored Anesthesia Care Procedure:                Pre-Anesthesia Assessment:                           - Prior to the procedure, a History and Physical                            was performed, and patient medications and                            allergies were reviewed. The patient's tolerance of                            previous anesthesia was also reviewed. The risks                            and benefits of the procedure and the sedation                            options and risks were discussed with the patient.                            All questions were answered, and informed consent                            was obtained. Prior Anticoagulants: The patient has                            taken no previous anticoagulant or antiplatelet                            agents. ASA Grade Assessment: II - A patient with                            mild systemic disease. After reviewing the risks  and benefits, the patient was deemed in                            satisfactory condition to undergo the procedure.                           After obtaining informed consent, the colonoscope                            was passed under direct vision. Throughout the                            procedure, the patient's blood pressure, pulse, and                            oxygen saturations  were monitored continuously. The                            Colonoscope was introduced through the anus and                            advanced to the the cecum, identified by                            appendiceal orifice and ileocecal valve. The                            colonoscopy was performed without difficulty. The                            patient tolerated the procedure well. The quality                            of the bowel preparation was excellent. The                            ileocecal valve, appendiceal orifice, and rectum                            were photographed. Scope In: 11:38:56 AM Scope Out: 11:54:47 AM Scope Withdrawal Time: 0 hours 13 minutes 59 seconds  Total Procedure Duration: 0 hours 15 minutes 51 seconds  Findings:                 The perianal and digital rectal examinations were                            normal.                           A tattoo was seen in the proximal transverse colon.                           Inflammation characterized by altered vascularity,  congestion (edema), erosions, erythema, linear                            erosions, loss of vascularity, pseudopolyps,                            scarring and aphthous ulcerations was found in a                            continuous and circumferential pattern from the                            anus to the hepatic flexure. The ascending colon                            and the cecum were spared. This was moderate in                            severity. Biopsies were taken with a cold forceps                            for histology.                           Unable to intubate terminal ileum, cannot exclude                            distal ileal stricture Complications:            No immediate complications. Estimated Blood Loss:     Estimated blood loss was minimal. Impression:               - A tattoo was seen in the proximal transverse                             colon.                           - Inflammatory bowel disease. Inflammation was                            found from the anus to the hepatic flexure. This                            was moderate in severity. Biopsied. Recommendation:           - Patient has a contact number available for                            emergencies. The signs and symptoms of potential                            delayed complications were discussed with the                            patient. Return to normal  activities tomorrow.                            Written discharge instructions were provided to the                            patient.                           - Resume previous diet.                           - Continue present medications.                           - Await pathology results.                           - Repeat colonoscopy in 3 years for surveillance                            based on pathology results.                           - Return to GI clinic at the next available                            appointment in 4-6 weeks.                           - Use prednisone 40 mg PO once a day for 2 weeks.                            Followed by taper with decrease in dose by 5 mg                            every week Mauri Pole, MD 09/13/2019 12:01:46 PM This report has been signed electronically.

## 2019-09-14 ENCOUNTER — Telehealth: Payer: Self-pay

## 2019-09-14 ENCOUNTER — Other Ambulatory Visit: Payer: Self-pay

## 2019-09-14 NOTE — Telephone Encounter (Signed)
Called to the patient's listed phone number. No answer. Left a message to advise of her scheduled follow up appointment with Dr Silverio Decamp.

## 2019-09-15 ENCOUNTER — Telehealth: Payer: Self-pay | Admitting: *Deleted

## 2019-09-15 ENCOUNTER — Telehealth: Payer: Self-pay

## 2019-09-15 NOTE — Telephone Encounter (Signed)
Left message on f/u call 

## 2019-09-15 NOTE — Telephone Encounter (Signed)
Left message on follow up call. 

## 2019-09-25 ENCOUNTER — Encounter: Payer: Self-pay | Admitting: Gastroenterology

## 2019-10-02 DIAGNOSIS — Z20822 Contact with and (suspected) exposure to covid-19: Secondary | ICD-10-CM | POA: Diagnosis not present

## 2019-10-02 DIAGNOSIS — Z03818 Encounter for observation for suspected exposure to other biological agents ruled out: Secondary | ICD-10-CM | POA: Diagnosis not present

## 2019-10-10 DIAGNOSIS — Z113 Encounter for screening for infections with a predominantly sexual mode of transmission: Secondary | ICD-10-CM | POA: Diagnosis not present

## 2019-10-11 ENCOUNTER — Telehealth: Payer: Medicaid Other | Admitting: Gastroenterology

## 2019-10-11 NOTE — Progress Notes (Deleted)
Robin Garrison    [id number]    08-27-2000  Primary Care Physician:Patient, No Pcp Per  Referring Physician: No referring provider defined for this encounter.    I connected with@ on 10/11/19 at  9:10 AM EDT by telephone and verified that I am speaking with the correct person using two identifiers.  Location: Patient: Home Provider: Office   I discussed the limitations, risks, security and privacy concerns of performing an evaluation and management service by telephone and the availability of in person appointments. I also discussed with the patient that there may be a patient responsible charge related to this service. The patient expressed understanding and agreed to proceed.   The persons participating in this telemedicine service were myself and the patient  Interactive audio and video telecommunications were attempted between this provider and patient, however failed, due to patient having technical difficulties OR patient did not have access to video capability. We continued and completed visit with audio only.    Chief complaint: Ulcerative colitis HPI:  ***   Outpatient Encounter Medications as of 10/11/2019  Medication Sig  . Multiple Vitamins-Minerals (HAIR SKIN AND NAILS FORMULA PO) Take 2 capsules by mouth daily.  . Norgestrel-Ethinyl Estradiol (LO/OVRAL PO) Take by mouth.  . sulfaSALAzine (AZULFIDINE) 500 MG tablet Take 2 tablets (1,000 mg total) by mouth 2 (two) times daily. (Patient not taking: Reported on 09/13/2019)   No facility-administered encounter medications on file as of 10/11/2019.    Allergies as of 10/11/2019 - Review Complete 09/13/2019  Allergen Reaction Noted  . Lactose intolerance (gi) Other (See Comments) 05/08/2012    Past Medical History:  Diagnosis Date  . Chest pain 03/06/2013   Saw [facility] for CP on exertion in cold weather; cleared for sports, no restrictions  . Cholecystitis   . Colitis   . Proctitis   .  Proctitis     Past Surgical History:  Procedure Laterality Date  . EYE SURGERY Bilateral    age 32  . TONSILECTOMY/ADENOIDECTOMY WITH MYRINGOTOMY      Family History  Problem Relation Age of Onset  . Hypertension Mother   . Healthy Father   . Other Maternal Grandmother        stomach problems  . Irritable bowel syndrome Maternal Grandmother   . Diabetes Other   . Diabetes Paternal Aunt   . Vision loss Paternal Grandmother   . Diabetes Paternal Grandmother   . Heart disease Maternal Grandfather   . Heart disease Maternal Uncle   . Inflammatory bowel disease Neg Hx     Social History   Socioeconomic History  . Marital status: Single    Spouse name: Not on file  . Number of children: 1  . Years of education: Not on file  . Highest education level: Not on file  Occupational History  . Occupation: [employer]  Tobacco Use  . Smoking status: Never Smoker  . Smokeless tobacco: Never Used  Vaping Use  . Vaping Use: Never used  Substance and Sexual Activity  . Alcohol use: No    Alcohol/week: 0.0 standard drinks  . Drug use: No  . Sexual activity: Not Currently    Partners: Male    Birth control/protection: Pill  Other Topics Concern  . Not on file  Social History Narrative   8th grade   Social Determinants of Health   Financial Resource Strain: Low Risk   . Difficulty of Paying Living Expenses: Not  hard at all  Food Insecurity: No Food Insecurity  . Worried About Charity fundraiser in the Last Year: Never true  . Ran Out of Food in the Last Year: Never true  Transportation Needs: No Transportation Needs  . Lack of Transportation (Medical): No  . Lack of Transportation (Non-Medical): No  Physical Activity: Unknown  . Days of Exercise per Week: Patient refused  . Minutes of Exercise per Session: Patient refused  Stress: No Stress Concern Present  . Feeling of Stress : Not at all  Social Connections: Unknown  . Frequency of Communication with Friends and  Family: Patient refused  . Frequency of Social Gatherings with Friends and Family: Patient refused  . Attends Religious Services: Patient refused  . Active Member of Clubs or Organizations: Patient refused  . Attends Archivist Meetings: Patient refused  . Marital Status: Patient refused  Intimate Partner Violence: Not At Risk  . Fear of Current or Ex-Partner: No  . Emotionally Abused: No  . Physically Abused: No  . Sexually Abused: No      Review of systems: Review of Systems as per HPI All other systems reviewed and are negative.   Observations/Objective:   Data Reviewed:  Reviewed labs, radiology imaging, old records and pertinent past GI work up   Assessment and Plan/Recommendations:  ***     I discussed the assessment and treatment plan with the patient. The patient was provided an opportunity to ask questions and all were answered. The patient agreed with the plan and demonstrated an understanding of the instructions.   The patient was advised to call back or seek an in-person evaluation if the symptoms worsen or if the condition fails to improve as anticipated.  I provided *** minutes of non-face-to-face time during this encounter.   Harl Bowie, MD   CC: No ref. provider found

## 2019-11-01 DIAGNOSIS — N3001 Acute cystitis with hematuria: Secondary | ICD-10-CM | POA: Diagnosis not present

## 2019-11-01 DIAGNOSIS — Z113 Encounter for screening for infections with a predominantly sexual mode of transmission: Secondary | ICD-10-CM | POA: Diagnosis not present

## 2019-11-06 ENCOUNTER — Ambulatory Visit: Payer: Medicaid Other | Admitting: Gastroenterology

## 2020-03-26 ENCOUNTER — Other Ambulatory Visit: Payer: Self-pay | Admitting: *Deleted

## 2020-03-26 ENCOUNTER — Ambulatory Visit: Payer: Medicaid Other | Admitting: Advanced Practice Midwife

## 2020-03-26 DIAGNOSIS — Z30011 Encounter for initial prescription of contraceptive pills: Secondary | ICD-10-CM

## 2020-03-26 MED ORDER — NORGESTREL-ETHINYL ESTRADIOL 0.3-30 MG-MCG PO TABS: 1.0000 | ORAL_TABLET | Freq: Every day | ORAL | 0 refills | Status: DC

## 2020-03-26 NOTE — Telephone Encounter (Signed)
Patient late for annual appointment this AM. Had to reschedule for January 2022. Refill for Lo Ovral #1 pack sent to pharmacy.  Derl Barrow, RN

## 2020-04-01 ENCOUNTER — Telehealth: Payer: Self-pay

## 2020-04-01 NOTE — Telephone Encounter (Signed)
S/w pt and advised that she will need to follow up with PCP or urgent care to get rx refill for rash around her nose, pt agreed.

## 2020-04-23 ENCOUNTER — Ambulatory Visit: Payer: Medicaid Other | Admitting: Obstetrics & Gynecology

## 2020-04-25 ENCOUNTER — Other Ambulatory Visit: Payer: Self-pay | Admitting: Nurse Practitioner

## 2020-04-26 ENCOUNTER — Ambulatory Visit: Payer: Medicaid Other | Admitting: Nurse Practitioner

## 2020-05-03 ENCOUNTER — Other Ambulatory Visit: Payer: Self-pay

## 2020-05-03 ENCOUNTER — Ambulatory Visit (INDEPENDENT_AMBULATORY_CARE_PROVIDER_SITE_OTHER): Payer: Medicaid Other | Admitting: Obstetrics

## 2020-05-03 ENCOUNTER — Other Ambulatory Visit (HOSPITAL_COMMUNITY)
Admission: RE | Admit: 2020-05-03 | Discharge: 2020-05-03 | Disposition: A | Discharge status: Home / Self Care | Payer: Medicaid Other | Source: Ambulatory Visit | Attending: Obstetrics | Admitting: Obstetrics

## 2020-05-03 ENCOUNTER — Encounter: Payer: Self-pay | Admitting: Obstetrics

## 2020-05-03 VITALS — BP 120/70 | HR 78 | Wt 153.0 lb

## 2020-05-03 DIAGNOSIS — Z01419 Encounter for gynecological examination (general) (routine) without abnormal findings: Secondary | ICD-10-CM

## 2020-05-03 DIAGNOSIS — N898 Other specified noninflammatory disorders of vagina: Secondary | ICD-10-CM

## 2020-05-03 DIAGNOSIS — Z3009 Encounter for other general counseling and advice on contraception: Secondary | ICD-10-CM | POA: Diagnosis not present

## 2020-05-03 DIAGNOSIS — Z30011 Encounter for initial prescription of contraceptive pills: Secondary | ICD-10-CM

## 2020-05-03 DIAGNOSIS — Z113 Encounter for screening for infections with a predominantly sexual mode of transmission: Secondary | ICD-10-CM

## 2020-05-03 MED ORDER — NORGESTIMATE-ETH ESTRADIOL 0.25-35 MG-MCG PO TABS: 1.0000 | ORAL_TABLET | Freq: Every day | ORAL | 11 refills | Status: DC

## 2020-05-03 NOTE — Progress Notes (Signed)
Subjective:        Robin Garrison is a 20 y.o. female here for a routine exam.  Current complaints: Vaginal discharge.    Personal health questionnaire:  Is patient Ashkenazi Jewish, have a family history of breast and/or ovarian cancer: no Is there a family history of uterine cancer diagnosed at age < 43, gastrointestinal cancer, urinary tract cancer, family member who is a Field seismologist syndrome-associated carrier: no Is the patient overweight and hypertensive, family history of diabetes, personal history of gestational diabetes, preeclampsia or PCOS: no Is patient over 58, have PCOS,  family history of premature CHD under age 50, diabetes, smoke, have hypertension or peripheral artery disease:  no At any time, has a partner hit, kicked or otherwise hurt or frightened you?: no Over the past 2 weeks, have you felt down, depressed or hopeless?: no Over the past 2 weeks, have you felt little interest or pleasure in doing things?:no   Gynecologic History Patient's last menstrual period was 04/25/2020. Contraception: OCP (estrogen/progesterone) Last Pap: n/a. Results were: n/a Last mammogram: n/a. Results were: n/a  Obstetric History OB History  Gravida Para Term Preterm AB Living  1 1 1     1   SAB IAB Ectopic Multiple Live Births        0 1    # Outcome Date GA Lbr Len/2nd Weight Sex Delivery Anes PTL Lv  1 Term 02/07/19 [id number] / 01:35 7 lb 2.5 oz (3.246 kg) F Vag-Spont EPI  LIV     Birth Comments: WDLs    Past Medical History:  Diagnosis Date  . Chest pain 03/06/2013   Saw [facility] for CP on exertion in cold weather; cleared for sports, no restrictions  . Cholecystitis   . Colitis   . Proctitis   . Proctitis     Past Surgical History:  Procedure Laterality Date  . EYE SURGERY Bilateral    age 20 . TONSILECTOMY/ADENOIDECTOMY WITH MYRINGOTOMY       Current Outpatient Medications:  .  norgestimate-ethinyl estradiol (ORTHO-CYCLEN) 0.25-35 MG-MCG tablet, Take 1  tablet by mouth daily., Disp: 28 tablet, Rfl: 11 .  Multiple Vitamins-Minerals (HAIR SKIN AND NAILS FORMULA PO), Take 2 capsules by mouth daily. (Patient not taking: Reported on 05/03/2020), Disp: , Rfl:  .  sulfaSALAzine (AZULFIDINE) 500 MG tablet, Take 2 tablets (1,000 mg total) by mouth 2 (two) times daily. (Patient not taking: Reported on 05/03/2020), Disp: 60 tablet, Rfl: 6 .  triamcinolone (KENALOG) 0.025 % cream, Apply 1 application topically 2 (two) times daily. (Patient not taking: Reported on 05/03/2020), Disp: , Rfl:  Allergies  Allergen Reactions  . Lactose Intolerance (Gi) Other (See Comments)    Upset stomach    Social History   Tobacco Use  . Smoking status: Never Smoker  . Smokeless tobacco: Never Used  Substance Use Topics  . Alcohol use: No    Alcohol/week: 0.0 standard drinks    Family History  Problem Relation Age of Onset  . Hypertension Mother   . Healthy Father   . Other Maternal Grandmother        stomach problems  . Irritable bowel syndrome Maternal Grandmother   . Diabetes Other   . Diabetes Paternal Aunt   . Vision loss Paternal Grandmother   . Diabetes Paternal Grandmother   . Heart disease Maternal Grandfather   . Heart disease Maternal Uncle   . Inflammatory bowel disease Neg Hx       Review of Systems  Constitutional: negative for fatigue and weight loss Respiratory: negative for cough and wheezing Cardiovascular: negative for chest pain, fatigue and palpitations Gastrointestinal: negative for abdominal pain and change in bowel habits Musculoskeletal:negative for myalgias Neurological: negative for gait problems and tremors Behavioral/Psych: negative for abusive relationship, depression Endocrine: negative for temperature intolerance    Genitourinary:negative for abnormal menstrual periods, genital lesions, hot flashes, sexual problems and vaginal discharge Integument/breast: negative for breast lump, breast tenderness, nipple discharge and  skin lesion(s)    Objective:       BP 120/70   Pulse 78   Wt 153 lb (69.4 kg)   LMP 04/25/2020   BMI 27.98 kg/m  General:   alert  Skin:   no rash or abnormalities  Lungs:   clear to auscultation bilaterally  Heart:   regular rate and rhythm, S1, S2 normal, no murmur, click, rub or gallop  Breasts:   normal without suspicious masses, skin or nipple changes or axillary nodes  Abdomen:  normal findings: no organomegaly, soft, non-tender and no hernia  Pelvis:  External genitalia: normal general appearance Urinary system: urethral meatus normal and bladder without fullness, nontender Vaginal: normal without tenderness, induration or masses Cervix: normal appearance Adnexa: normal bimanual exam Uterus: anteverted and non-tender, normal size   Lab Review Urine pregnancy test Labs reviewed yes Radiologic studies reviewed no  50% of 20 min visit spent on counseling and coordination of care.   Assessment:     1. Encounter for routine gynecological examination with Papanicolaou smear of cervix Rx: - Cytology - PAP( [facility])  2. Vaginal discharge Rx: - Cervicovaginal ancillary only  3. Screening for STD (sexually transmitted disease) Rx: - RPR+HBsAg+HCVAb+...  4. Encounter for counseling regarding contraception - wants to continue with OCP's that is good for the skin and prevents acne  5. Encounter for initial prescription of contraceptive pills Rx: - norgestimate-ethinyl estradiol (ORTHO-CYCLEN) 0.25-35 MG-MCG tablet; Take 1 tablet by mouth daily.  Dispense: 28 tablet; Refill: 11    Plan:    Education reviewed: calcium supplements, depression evaluation, low fat, low cholesterol diet, safe sex/STD prevention, self breast exams and weight bearing exercise. Contraception: OCP (estrogen/progesterone). Follow up in: 1 year.    Orders Placed This Encounter  Procedures  . RPR+HBsAg+HCVAb+...    Shelly Bombard, MD 05/03/2020 9:17 AM

## 2020-05-03 NOTE — Progress Notes (Signed)
Pt presents for annual, pap, and all STD screening.  Pt c/o BCP causing acne.  Pt also requests UPT d/t lighter periods

## 2020-05-04 LAB — RPR+HBSAG+HCVAB+...
HIV Screen 4th Generation wRfx: NONREACTIVE
Hep C Virus Ab: <0.1 s/co ratio (ref 0.0–0.9)
Hepatitis B Surface Ag: NEGATIVE
RPR Ser Ql: NONREACTIVE

## 2020-05-06 LAB — CERVICOVAGINAL ANCILLARY ONLY
Bacterial Vaginitis (gardnerella): NEGATIVE
Chlamydia: NEGATIVE
Comment: NEGATIVE
Comment: NEGATIVE
Comment: NEGATIVE
Comment: NORMAL
Neisseria Gonorrhea: NEGATIVE
Trichomonas: NEGATIVE

## 2020-05-08 LAB — CYTOLOGY - PAP: Diagnosis: NEGATIVE

## 2020-06-05 ENCOUNTER — Encounter: Payer: Self-pay | Admitting: Obstetrics and Gynecology

## 2020-06-05 ENCOUNTER — Ambulatory Visit (INDEPENDENT_AMBULATORY_CARE_PROVIDER_SITE_OTHER): Payer: Medicaid Other | Admitting: Obstetrics and Gynecology

## 2020-06-05 ENCOUNTER — Other Ambulatory Visit (HOSPITAL_COMMUNITY)
Admission: RE | Admit: 2020-06-05 | Discharge: 2020-06-05 | Disposition: A | Discharge status: Home / Self Care | Payer: Medicaid Other | Source: Ambulatory Visit | Attending: Obstetrics and Gynecology | Admitting: Obstetrics and Gynecology

## 2020-06-05 ENCOUNTER — Other Ambulatory Visit: Payer: Self-pay

## 2020-06-05 DIAGNOSIS — Z202 Contact with and (suspected) exposure to infections with a predominantly sexual mode of transmission: Secondary | ICD-10-CM

## 2020-06-05 NOTE — Progress Notes (Signed)
RGYN patient here to discuss labiaplasty.    Last Pap: 05/03/2020  Pt wants STD Screening.     CC: None

## 2020-06-05 NOTE — Progress Notes (Signed)
Patient ID: Robin Garrison, female   DOB: 24-Feb-2001, 20 y.o.   MRN: [id number] Ms Koenen presents for STD testing as well discuss labiaplasty.  She reports some vaginal odor and slight discharge.  She reports occ swelling of her labia since her vaginal delivery. Swelling resolves without problems. The swelling does not cause her any pain or discomfort. Sexual active without problems.   PE AF VSS Lungs clear Heart RRR Abd soft + BS GU NL EGBUS, vaginal mucosa normal, slight vaginal discharge, cervix no lesions, uterus small, mobile, non tender, no adnexal masses.  A/P STD exposure        Nl Exam  STD testing as per pt request. Pt reassured in regards to her labia. Labial exam normal, no indication for labiaplasty. Pt voiced understanding. F/U per STD testing.

## 2020-06-05 NOTE — Patient Instructions (Signed)
Health Maintenance, Female Adopting a healthy lifestyle and getting preventive care are important in promoting health and wellness. Ask your health care provider about:  The right schedule for you to have regular tests and exams.  Things you can do on your own to prevent diseases and keep yourself healthy. What should I know about diet, weight, and exercise? Eat a healthy diet  Eat a diet that includes plenty of vegetables, fruits, low-fat dairy products, and lean protein.  Do not eat a lot of foods that are high in solid fats, added sugars, or sodium.   Maintain a healthy weight Body mass index (BMI) is used to identify weight problems. It estimates body fat based on height and weight. Your health care provider can help determine your BMI and help you achieve or maintain a healthy weight. Get regular exercise Get regular exercise. This is one of the most important things you can do for your health. Most adults should:  Exercise for at least 150 minutes each week. The exercise should increase your heart rate and make you sweat (moderate-intensity exercise).  Do strengthening exercises at least twice a week. This is in addition to the moderate-intensity exercise.  Spend less time sitting. Even light physical activity can be beneficial. Watch cholesterol and blood lipids Have your blood tested for lipids and cholesterol at 22 years of age, then have this test every 5 years. Have your cholesterol levels checked more often if:  Your lipid or cholesterol levels are high.  You are older than 22 years of age.  You are at high risk for heart disease. What should I know about cancer screening? Depending on your health history and family history, you may need to have cancer screening at various ages. This may include screening for:  Breast cancer.  Cervical cancer.  Colorectal cancer.  Skin cancer.  Lung cancer. What should I know about heart disease, diabetes, and high blood  pressure? Blood pressure and heart disease  High blood pressure causes heart disease and increases the risk of stroke. This is more likely to develop in people who have high blood pressure readings, are of African descent, or are overweight.  Have your blood pressure checked: ? Every 3-5 years if you are 18-39 years of age. ? Every year if you are 40 years old or older. Diabetes Have regular diabetes screenings. This checks your fasting blood sugar level. Have the screening done:  Once every three years after age 40 if you are at a normal weight and have a low risk for diabetes.  More often and at a younger age if you are overweight or have a high risk for diabetes. What should I know about preventing infection? Hepatitis B If you have a higher risk for hepatitis B, you should be screened for this virus. Talk with your health care provider to find out if you are at risk for hepatitis B infection. Hepatitis C Testing is recommended for:  Everyone born from 1945 through 1965.  Anyone with known risk factors for hepatitis C. Sexually transmitted infections (STIs)  Get screened for STIs, including gonorrhea and chlamydia, if: ? You are sexually active and are younger than 22 years of age. ? You are older than 22 years of age and your health care provider tells you that you are at risk for this type of infection. ? Your sexual activity has changed since you were last screened, and you are at increased risk for chlamydia or gonorrhea. Ask your health care provider   if you are at risk.  Ask your health care provider about whether you are at high risk for HIV. Your health care provider may recommend a prescription medicine to help prevent HIV infection. If you choose to take medicine to prevent HIV, you should first get tested for HIV. You should then be tested every 3 months for as long as you are taking the medicine. Pregnancy  If you are about to stop having your period (premenopausal) and  you may become pregnant, seek counseling before you get pregnant.  Take 400 to 800 micrograms (mcg) of folic acid every day if you become pregnant.  Ask for birth control (contraception) if you want to prevent pregnancy. Osteoporosis and menopause Osteoporosis is a disease in which the bones lose minerals and strength with aging. This can result in bone fractures. If you are 65 years old or older, or if you are at risk for osteoporosis and fractures, ask your health care provider if you should:  Be screened for bone loss.  Take a calcium or vitamin D supplement to lower your risk of fractures.  Be given hormone replacement therapy (HRT) to treat symptoms of menopause. Follow these instructions at home: Lifestyle  Do not use any products that contain nicotine or tobacco, such as cigarettes, e-cigarettes, and chewing tobacco. If you need help quitting, ask your health care provider.  Do not use street drugs.  Do not share needles.  Ask your health care provider for help if you need support or information about quitting drugs. Alcohol use  Do not drink alcohol if: ? Your health care provider tells you not to drink. ? You are pregnant, may be pregnant, or are planning to become pregnant.  If you drink alcohol: ? Limit how much you use to 0-1 drink a day. ? Limit intake if you are breastfeeding.  Be aware of how much alcohol is in your drink. In the [place] one drink equals one 12 oz bottle of beer (355 mL), one 5 oz glass of wine (148 mL), or one 1 oz glass of hard liquor (44 mL). General instructions  Schedule regular health, dental, and eye exams.  Stay current with your vaccines.  Tell your health care provider if: ? You often feel depressed. ? You have ever been abused or do not feel safe at home. Summary  Adopting a healthy lifestyle and getting preventive care are important in promoting health and wellness.  Follow your health care provider's instructions about healthy  diet, exercising, and getting tested or screened for diseases.  Follow your health care provider's instructions on monitoring your cholesterol and blood pressure. This information is not intended to replace advice given to you by your health care provider. Make sure you discuss any questions you have with your health care provider. Document Revised: 03/30/2018 Document Reviewed: 03/30/2018 Elsevier Patient Education  2021 Elsevier Inc.  

## 2020-06-06 ENCOUNTER — Other Ambulatory Visit: Payer: Self-pay

## 2020-06-06 LAB — CERVICOVAGINAL ANCILLARY ONLY
Bacterial Vaginitis (gardnerella): POSITIVE — AB
Candida Glabrata: NEGATIVE
Candida Vaginitis: NEGATIVE
Chlamydia: NEGATIVE
Comment: NEGATIVE
Comment: NEGATIVE
Comment: NEGATIVE
Comment: NEGATIVE
Comment: NEGATIVE
Comment: NORMAL
Neisseria Gonorrhea: NEGATIVE
Trichomonas: NEGATIVE

## 2020-06-06 LAB — HEPATITIS B SURFACE ANTIGEN: Hepatitis B Surface Ag: NEGATIVE

## 2020-06-06 LAB — HIV ANTIBODY (ROUTINE TESTING W REFLEX): HIV Screen 4th Generation wRfx: NONREACTIVE

## 2020-06-06 LAB — RPR: RPR Ser Ql: NONREACTIVE

## 2020-06-06 MED ORDER — METRONIDAZOLE 500 MG PO TABS: 500.0000 mg | ORAL_TABLET | Freq: Two times a day (BID) | ORAL | 0 refills | Status: DC

## 2020-06-22 DIAGNOSIS — Z202 Contact with and (suspected) exposure to infections with a predominantly sexual mode of transmission: Secondary | ICD-10-CM

## 2020-06-25 MED ORDER — AZITHROMYCIN 500 MG PO TABS: 1000.0000 mg | ORAL_TABLET | Freq: Once | ORAL | 0 refills | Status: AC

## 2020-06-28 ENCOUNTER — Other Ambulatory Visit: Payer: Self-pay

## 2020-06-28 ENCOUNTER — Other Ambulatory Visit (HOSPITAL_COMMUNITY)
Admission: RE | Admit: 2020-06-28 | Discharge: 2020-06-28 | Disposition: A | Discharge status: Home / Self Care | Payer: Medicaid Other | Source: Ambulatory Visit | Attending: Obstetrics | Admitting: Obstetrics

## 2020-06-28 ENCOUNTER — Ambulatory Visit (INDEPENDENT_AMBULATORY_CARE_PROVIDER_SITE_OTHER): Payer: Medicaid Other

## 2020-06-28 VITALS — BP 115/79 | HR 75 | Wt 150.0 lb

## 2020-06-28 DIAGNOSIS — Z202 Contact with and (suspected) exposure to infections with a predominantly sexual mode of transmission: Secondary | ICD-10-CM

## 2020-06-28 NOTE — Progress Notes (Addendum)
SUBJECTIVE:  20 y.o. female complains of vaginal discharge for 5 day(s) and possible exposure to STD. Denies abnormal vaginal bleeding or significant pelvic pain or fever. No UTI symptoms.  .  Patient's last menstrual period was 05/24/2020 (exact date).  OBJECTIVE:  She appears well, afebrile. Urine dipstick: not done.  ASSESSMENT:  Vaginal Discharge  Possible STD exposure   PLAN:  GC, chlamydia, trichomonas, BVAG, CVAG probe sent to lab. Treatment: To be determined once lab results are received ROV prn if symptoms persist or worsen.  Patient was assessed and managed by nursing staff during this encounter. I have reviewed the chart and agree with the documentation and plan. I have also made any necessary editorial changes.  Baltazar Najjar, MD 07/02/2020 8:26 AM

## 2020-06-30 LAB — CERVICOVAGINAL ANCILLARY ONLY
Bacterial Vaginitis (gardnerella): NEGATIVE
Candida Glabrata: NEGATIVE
Candida Vaginitis: POSITIVE — AB
Chlamydia: POSITIVE — AB
Comment: NEGATIVE
Comment: NEGATIVE
Comment: NEGATIVE
Comment: NEGATIVE
Comment: NEGATIVE
Comment: NORMAL
Neisseria Gonorrhea: NEGATIVE
Trichomonas: NEGATIVE

## 2020-07-01 ENCOUNTER — Telehealth: Payer: Self-pay

## 2020-07-01 MED ORDER — AZITHROMYCIN 500 MG PO TABS: 1000.0000 mg | ORAL_TABLET | Freq: Once | ORAL | 0 refills | Status: AC

## 2020-07-01 MED ORDER — FLUCONAZOLE 150 MG PO TABS: 150.0000 mg | ORAL_TABLET | Freq: Once | ORAL | 0 refills | Status: AC

## 2020-07-01 NOTE — Telephone Encounter (Signed)
Pt c/o vaginal itching. Pt also c/o noticing blood on tissue after voiding, denies pain. +CT on 06/28/20 admits to having unprotected IC with partner on Saturday. She is unsure if partner was treated.  Reiterated with pt no unprotected IC until she is sure her partner has been treated.  TOC scheduled  Rx for Diflucan and Azithromycin sent to pt's pharmacy.

## 2020-07-02 ENCOUNTER — Other Ambulatory Visit: Payer: Self-pay | Admitting: Obstetrics

## 2020-07-02 DIAGNOSIS — A749 Chlamydial infection, unspecified: Secondary | ICD-10-CM

## 2020-07-02 DIAGNOSIS — B3731 Acute candidiasis of vulva and vagina: Secondary | ICD-10-CM

## 2020-07-02 DIAGNOSIS — B379 Candidiasis, unspecified: Secondary | ICD-10-CM

## 2020-07-02 DIAGNOSIS — B373 Candidiasis of vulva and vagina: Secondary | ICD-10-CM

## 2020-07-02 MED ORDER — FLUCONAZOLE 150 MG PO TABS: 150.0000 mg | ORAL_TABLET | Freq: Once | ORAL | 0 refills | Status: AC

## 2020-07-02 MED ORDER — DOXYCYCLINE HYCLATE 100 MG PO CAPS
100.0000 mg | ORAL_CAPSULE | Freq: Two times a day (BID) | ORAL | 0 refills | Status: DC
Start: 2020-07-02 — End: 2020-07-25

## 2020-07-21 ENCOUNTER — Encounter (HOSPITAL_COMMUNITY): Payer: Self-pay | Admitting: Emergency Medicine

## 2020-07-21 ENCOUNTER — Other Ambulatory Visit: Payer: Self-pay

## 2020-07-21 ENCOUNTER — Emergency Department (HOSPITAL_COMMUNITY): Payer: Medicaid Other

## 2020-07-21 ENCOUNTER — Emergency Department (HOSPITAL_COMMUNITY)
Admission: EM | Admit: 2020-07-21 | Discharge: 2020-07-21 | Disposition: A | Discharge status: Home / Self Care | Payer: Medicaid Other | Attending: Emergency Medicine | Admitting: Emergency Medicine

## 2020-07-21 DIAGNOSIS — R000 Tachycardia, unspecified: Secondary | ICD-10-CM | POA: Diagnosis not present

## 2020-07-21 DIAGNOSIS — K5100 Ulcerative (chronic) pancolitis without complications: Secondary | ICD-10-CM | POA: Insufficient documentation

## 2020-07-21 DIAGNOSIS — D1809 Hemangioma of other sites: Secondary | ICD-10-CM | POA: Diagnosis not present

## 2020-07-21 DIAGNOSIS — K51011 Ulcerative (chronic) pancolitis with rectal bleeding: Secondary | ICD-10-CM | POA: Diagnosis not present

## 2020-07-21 DIAGNOSIS — K529 Noninfective gastroenteritis and colitis, unspecified: Secondary | ICD-10-CM | POA: Diagnosis not present

## 2020-07-21 DIAGNOSIS — K625 Hemorrhage of anus and rectum: Secondary | ICD-10-CM | POA: Diagnosis present

## 2020-07-21 DIAGNOSIS — K5190 Ulcerative colitis, unspecified, without complications: Secondary | ICD-10-CM | POA: Diagnosis not present

## 2020-07-21 DIAGNOSIS — R42 Dizziness and giddiness: Secondary | ICD-10-CM | POA: Diagnosis not present

## 2020-07-21 DIAGNOSIS — R202 Paresthesia of skin: Secondary | ICD-10-CM | POA: Diagnosis not present

## 2020-07-21 LAB — CBC WITH DIFFERENTIAL/PLATELET
Abs Immature Granulocytes: 0.03 10*3/uL (ref 0.00–0.07)
Basophils Absolute: 0 10*3/uL (ref 0.0–0.1)
Basophils Relative: 1 %
Eosinophils Absolute: 0.1 10*3/uL (ref 0.0–0.5)
Eosinophils Relative: 1 %
HCT: 39.3 % (ref 36.0–46.0)
Hemoglobin: 12.2 g/dL (ref 12.0–15.0)
Immature Granulocytes: 0 %
Lymphocytes Relative: 19 %
Lymphs Abs: 1.6 10*3/uL (ref 0.7–4.0)
MCH: 30.1 pg (ref 26.0–34.0)
MCHC: 31 g/dL (ref 30.0–36.0)
MCV: 97 fL (ref 80.0–100.0)
Monocytes Absolute: 0.9 10*3/uL (ref 0.1–1.0)
Monocytes Relative: 10 %
Neutro Abs: 6.1 10*3/uL (ref 1.7–7.7)
Neutrophils Relative %: 69 %
Platelets: 481 10*3/uL — ABNORMAL HIGH (ref 150–400)
RBC: 4.05 MIL/uL (ref 3.87–5.11)
RDW: 11.9 % (ref 11.5–15.5)
WBC: 8.7 10*3/uL (ref 4.0–10.5)
nRBC: 0 % (ref 0.0–0.2)

## 2020-07-21 LAB — COMPREHENSIVE METABOLIC PANEL
ALT: 11 U/L (ref 0–44)
AST: 14 U/L — ABNORMAL LOW (ref 15–41)
Albumin: 3.1 g/dL — ABNORMAL LOW (ref 3.5–5.0)
Alkaline Phosphatase: 44 U/L (ref 38–126)
Anion gap: 9 (ref 5–15)
BUN: <5 mg/dL — ABNORMAL LOW (ref 6–20)
CO2: 27 mmol/L (ref 22–32)
Calcium: 8.9 mg/dL (ref 8.9–10.3)
Chloride: 100 mmol/L (ref 98–111)
Creatinine, Ser: 0.9 mg/dL (ref 0.44–1.00)
GFR, Estimated: >60 mL/min (ref >60)
Glucose, Bld: 93 mg/dL (ref 70–99)
Potassium: 3.4 mmol/L — ABNORMAL LOW (ref 3.5–5.1)
Sodium: 136 mmol/L (ref 135–145)
Total Bilirubin: 0.7 mg/dL (ref 0.3–1.2)
Total Protein: 6.6 g/dL (ref 6.5–8.1)

## 2020-07-21 LAB — TYPE AND SCREEN
ABO/RH(D): B POS
Antibody Screen: NEGATIVE

## 2020-07-21 LAB — I-STAT BETA HCG BLOOD, ED (MC, WL, AP ONLY): I-stat hCG, quantitative: <5 m[IU]/mL (ref <5)

## 2020-07-21 LAB — C-REACTIVE PROTEIN: CRP: 6.1 mg/dL — ABNORMAL HIGH (ref <1.0)

## 2020-07-21 LAB — SEDIMENTATION RATE: Sed Rate: 28 mm/hr — ABNORMAL HIGH (ref 0–22)

## 2020-07-21 MED ORDER — PREDNISONE 20 MG PO TABS: ORAL_TABLET | ORAL | 0 refills | Status: DC

## 2020-07-21 MED ORDER — IOHEXOL 300 MG/ML  SOLN
100.0000 mL | Freq: Once | INTRAMUSCULAR | Status: AC | PRN
  Administered 2020-07-21: 100 mL via INTRAVENOUS

## 2020-07-21 MED ORDER — FOLIC ACID 1 MG PO TABS: 1.0000 mg | ORAL_TABLET | Freq: Every day | ORAL | 0 refills | Status: DC

## 2020-07-21 MED ORDER — FOLIC ACID 1 MG PO TABS: 1.0000 mg | ORAL_TABLET | Freq: Every day | ORAL | 0 refills | Status: AC

## 2020-07-21 NOTE — ED Provider Notes (Signed)
Patient placed in Quick Look pathway, seen and evaluated   Chief Complaint: Rectal Bleeding   HPI: 20 year old female with history of ulcerative colitis presents with rectal bleeding.  Reports she has been having bleeding and abdominal cramping which has been ongoing for 2 months.  She reports she has had dark and bright red rectal bleeding and is having what she estimates has 30+ bloody bowel movements daily.  She has been taking steroids for the past several weeks with no improvement in her symptoms.  Has not seen her GI doctor, followed by Dr. Silverio Decamp  ROS: + Rectal bleeding, abdominal pain  -Syncope  Physical Exam:   Gen: No distress  Neuro: Awake and Alert  Skin: Warm    Focused Exam: Mild generalized lower abdominal tenderness noted without guarding.   Initiation of care has begun. The patient has been counseled on the process, plan, and necessity for staying for the completion/evaluation, and the remainder of the medical screening examination  MSE was initiated and I personally evaluated the patient and placed orders (if any) at  10:44 AM on July 21, 2020.  The patient appears stable so that the remainder of the MSE may be completed by another provider.   Jacqlyn Larsen, PA-C 07/21/20 1054    Lennice Sites, DO 07/21/20 1404

## 2020-07-21 NOTE — Discharge Instructions (Addendum)
GI will be working to get you an earlier appointment

## 2020-07-21 NOTE — ED Triage Notes (Signed)
Pt reports rectal bleeding and multiple BMs per day x 2 months with intermittent L sided abd pain and feeling lightheaded.  States she started taking a prednisone 1 month ago without improvement.

## 2020-07-21 NOTE — ED Provider Notes (Addendum)
[facility] EMERGENCY DEPARTMENT Provider Note   CSN: [id number] Arrival date & time: 07/21/20  1041     History Chief Complaint  Patient presents with  . Rectal Bleeding    Robin Garrison is a 20 y.o. female.  She has a history of ulcerative colitis and last had a colonoscopy in May 2021.  She states that she has had this condition since age 40.  She is feeling weak, and she was having some tingling in her feet.  She has been unable to care for her 14-year-old daughter secondary to her extreme fatigue.  She does have a GI appointment, but it is not until June.  The history is provided by the patient.  Rectal Bleeding Quality:  Bright red, black and tarry and maroon Amount:  Copious Timing:  Intermittent Chronicity:  Recurrent Context comment:  History of ulcerative colitis Similar prior episodes: yes   Relieved by:  Nothing Worsened by:  Nothing Ineffective treatments: prednisone and sulfasalazine. Associated symptoms: abdominal pain and light-headedness   Associated symptoms: no fever and no vomiting        Past Medical History:  Diagnosis Date  . Chest pain 03/06/2013   Saw [facility] cardiolgy for CP on exertion in cold weather; cleared for sports, no restrictions  . Chest pain on exertion 03/06/2013  . Cholecystitis   . Colitis   . Proctitis   . Proctitis     Patient Active Problem List   Diagnosis Date Noted  . Possible exposure to STD 06/05/2020  . Vitamin D deficiency 12/28/2014  . 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 Excessive gas 11/24/2012  . Ulcerative colitis without complications [facility])     Past Surgical History:  Procedure Laterality Date  . EYE SURGERY Bilateral    age 44  . TONSILECTOMY/ADENOIDECTOMY WITH MYRINGOTOMY       OB History    Gravida  1   Para  1   Term  1   Preterm      AB      Living  1     SAB      IAB      Ectopic      Multiple  0   Live Births   1           Family History  Problem Relation Age of Onset  . Hypertension Mother   . Healthy Father   . Other Maternal Grandmother        stomach problems  . Irritable bowel syndrome Maternal Grandmother   . Diabetes Other   . Diabetes Paternal Aunt   . Vision loss Paternal Grandmother   . Diabetes Paternal Grandmother   . Heart disease Maternal Grandfather   . Heart disease Maternal Uncle   . Inflammatory bowel disease Neg Hx     Social History   Tobacco Use  . Smoking status: Never Smoker  . Smokeless tobacco: Never Used  Vaping Use  . Vaping Use: Never used  Substance Use Topics  . Alcohol use: No    Alcohol/week: 0.0 standard drinks  . Drug use: No    Home Medications Prior to Admission medications   Medication Sig Start Date End Date Taking? Authorizing Provider  doxycycline (VIBRAMYCIN) 100 MG capsule Take 1 capsule (100 mg total) by mouth 2 (two) times daily. 07/02/20   Shelly Bombard, MD  metroNIDAZOLE (FLAGYL) 500 MG tablet Take 1 tablet (500 mg total) by mouth 2 (two)  times daily. 06/06/20   Chancy Milroy, MD  norgestimate-ethinyl estradiol (ORTHO-CYCLEN) 0.25-35 MG-MCG tablet Take 1 tablet by mouth daily. 05/03/20   Shelly Bombard, MD    Allergies    Lactose intolerance (gi)  Review of Systems   Review of Systems  Constitutional: Negative for chills and fever.  HENT: Negative for ear pain and sore throat.   Eyes: Negative for pain and visual disturbance.  Respiratory: Negative for cough and shortness of breath.   Cardiovascular: Negative for chest pain and palpitations.  Gastrointestinal: Positive for abdominal pain, blood in stool and hematochezia. Negative for vomiting.  Genitourinary: Negative for dysuria and hematuria.  Musculoskeletal: Negative for arthralgias and back pain.  Skin: Negative for color change and rash.  Neurological: Positive for light-headedness. Negative for seizures and syncope.  All other systems reviewed and are  negative.   Physical Exam Updated Vital Signs BP 100/65   Pulse 78   Temp 98.3 F (36.8 C)   Resp 13   LMP 06/20/2020   SpO2 100%   Physical Exam Vitals and nursing note reviewed.  Constitutional:      General: She is not in acute distress.    Appearance: She is well-developed.  HENT:     Head: Normocephalic and atraumatic.  Eyes:     Conjunctiva/sclera: Conjunctivae normal.  Cardiovascular:     Rate and Rhythm: Normal rate and regular rhythm.     Heart sounds: No murmur heard.   Pulmonary:     Effort: Pulmonary effort is normal. No respiratory distress.     Breath sounds: Normal breath sounds.  Abdominal:     Palpations: Abdomen is soft.     Tenderness: There is no abdominal tenderness.  Musculoskeletal:        General: No swelling or deformity. Normal range of motion.     Cervical back: Neck supple.  Skin:    General: Skin is warm and dry.  Neurological:     General: No focal deficit present.     Mental Status: She is alert. Mental status is at baseline.  Psychiatric:        Mood and Affect: Mood normal.     ED Results / Procedures / Treatments   Labs (all labs ordered are listed, but only abnormal results are displayed) Labs Reviewed  CBC WITH DIFFERENTIAL/PLATELET - Abnormal; Notable for the following components:      Result Value   Platelets 481 (*)    All other components within normal limits  COMPREHENSIVE METABOLIC PANEL - Abnormal; Notable for the following components:   Potassium 3.4 (*)    BUN <5 (*)    Albumin 3.1 (*)    AST 14 (*)    All other components within normal limits  C DIFFICILE QUICK SCREEN W PCR REFLEX  SEDIMENTATION RATE  C-REACTIVE PROTEIN  I-STAT BETA HCG BLOOD, ED ([facility], [facility], [facility] ONLY)  POC OCCULT BLOOD, ED  TYPE AND SCREEN    EKG None  Radiology CT Abdomen Pelvis W Contrast  Result Date: 07/21/2020 CLINICAL DATA:  20 year old female with inflammatory bowel disease (ulcerative colitis). Rectal bleeding. EXAM: CT  ABDOMEN AND PELVIS WITH CONTRAST TECHNIQUE: Multidetector CT imaging of the abdomen and pelvis was performed using the standard protocol following bolus administration of intravenous contrast. CONTRAST:  131m OMNIPAQUE IOHEXOL 300 MG/ML  SOLN COMPARISON:  CT 04/19/2019 FINDINGS: Lower chest: Lung bases are clear. Hepatobiliary: Benign hemangioma again demonstrated in the RIGHT hepatic lobe measuring 2.2 cm. No biliary duct dilatation.  Gallbladder normal. Pancreas: Pancreas is normal. No ductal dilatation. No pancreatic inflammation. Spleen: Normal spleen Adrenals/urinary tract: Adrenal glands and kidneys are normal. The ureters and bladder normal. Stomach/Bowel: Stomach, duodenum small-bowel normal. Cecum dips into the pelvis. Appendix normal. Ascending colon is normal. Beginning in the transverse colon there is submucosal edema and mucosal enhancement. This bowel inflammation extends in a continuous fashion through the splenic flexure and descending colon. Mucosal enhancement and submucosal edema extends through sigmoid colon to the rectum. No evidence of fistula or abscess.  No bowel obstruction. Vascular/Lymphatic: Abdominal aorta is normal caliber. No periportal or retroperitoneal adenopathy. No pelvic adenopathy. Reproductive: Uterus and adnexa unremarkable. Other: No free fluid. Musculoskeletal: No aggressive osseous lesion. IMPRESSION: 1. Long segment of mucosal enhancement and submucosal edema extending from the distal rectum to the hepatic flexure of the transverse colon. Findings consistent with ulcerative colitis. Pattern of inflammation is similar to CT 04/19/2019. 2. No fistula or abscess. 3. Remainder of the small bowel and colon are normal. Electronically Signed   By: Suzy Bouchard M.D.   On: 07/21/2020 14:30    Procedures Procedures   Medications Ordered in ED Medications  iohexol (OMNIPAQUE) 300 MG/ML solution 100 mL (100 mLs Intravenous Contrast Given 07/21/20 1340)    ED Course  I  have reviewed the triage vital signs and the nursing notes.  Pertinent labs & imaging results that were available during my care of the patient were reviewed by me and considered in my medical decision making (see chart for details).  Clinical Course as of 07/21/20 1507  Sun Jul 21, 2020  1500 I spoke with GI. Recommend 60 mg prednisone x 2 weeks then 41m until follow-up. Needs folic acid with her sulfasalazine. GI will expedite f/u.  [AW]    Clinical Course User Index [AW] WArnaldo Natal MD   MDM Rules/Calculators/A&P                          EMacaoI Foor presents with signs and symptoms of an ulcerative colitis flare.  She had mild tachycardia on presentation but otherwise had normal vital signs throughout the rest of her ED course.  She was evaluated for anemia, and her hemoglobin was within normal limits.  She has slight evidence of malnutrition with a low albumin and a slightly low potassium.  However, her labs otherwise look good.  CT scan does not reveal a complication from ulcerative colitis.  I have coordinated with GI, and they recommend that she resume a high dose of prednisone.  I will attempt to expedite follow-up.  They did request inflammatory markers and a C. difficile test. Final Clinical Impression(s) / ED Diagnoses Final diagnoses:  Ulcerative pancolitis without complication ([place]    Rx / DC Orders ED Discharge Orders         Ordered    predniSONE (DELTASONE) 20 MG tablet        [id number]   folic acid (FOLVITE) 1 MG tablet  Daily        07/21/20 1507           WArnaldo Natal MD 07/21/20 1509    WArnaldo Natal MD 07/21/20 1546

## 2020-07-22 ENCOUNTER — Telehealth: Payer: Self-pay | Admitting: Gastroenterology

## 2020-07-22 ENCOUNTER — Telehealth: Payer: Self-pay | Admitting: *Deleted

## 2020-07-22 NOTE — Telephone Encounter (Signed)
Transition Care Management Follow-up Telephone Call  Date of discharge and from where: 07/21/2020 [facility]  How have you been since you were released from the hospital? "Feeling a little better"  Any questions or concerns? No  Items Reviewed:  Did the pt receive and understand the discharge instructions provided? Yes   Medications obtained and verified? Yes   Other? No   Any new allergies since your discharge? No   Dietary orders reviewed? No  Do you have support at home? Yes    Functional Questionnaire: (I = Independent and D = Dependent) ADLs: I  Bathing/Dressing- I  Meal Prep- I  Eating- I  Maintaining continence- I  Transferring/Ambulation- I  Managing Meds- I  Follow up appointments reviewed:   PCP Hospital f/u appt confirmed? No    Specialist Hospital f/u appt confirmed? Yes  Scheduled to see GI on 09/24/2020 @ 0950 for a routine appointment.  Are transportation arrangements needed? No   If their condition worsens, is the pt aware to call PCP or go to the Emergency Dept.? Yes  Was the patient provided with contact information for the PCP's office or ED? Yes  Was to pt encouraged to call back with questions or concerns? Yes

## 2020-07-23 ENCOUNTER — Ambulatory Visit: Payer: Medicaid Other | Admitting: Gastroenterology

## 2020-07-23 NOTE — Telephone Encounter (Signed)
Pt did not come for OV.

## 2020-07-24 ENCOUNTER — Telehealth: Payer: Self-pay | Admitting: Gastroenterology

## 2020-07-24 NOTE — Telephone Encounter (Signed)
Patient did not show for her appointment yesterday.  She is not taking meds as prescribed last year or at ED.  She is reporting 30 + bloody BMs a day.  She is not able to eat due to the vomiting.  She will come in and see Nicoletta Ba PA tomorrow at 2:30

## 2020-07-25 ENCOUNTER — Other Ambulatory Visit: Payer: Self-pay

## 2020-07-25 ENCOUNTER — Other Ambulatory Visit (INDEPENDENT_AMBULATORY_CARE_PROVIDER_SITE_OTHER): Payer: Medicaid Other

## 2020-07-25 ENCOUNTER — Encounter: Payer: Self-pay | Admitting: Physician Assistant

## 2020-07-25 ENCOUNTER — Ambulatory Visit (INDEPENDENT_AMBULATORY_CARE_PROVIDER_SITE_OTHER): Payer: Medicaid Other | Admitting: Physician Assistant

## 2020-07-25 VITALS — BP 110/60 | HR 80 | Ht 62.0 in | Wt 142.2 lb

## 2020-07-25 DIAGNOSIS — K625 Hemorrhage of anus and rectum: Secondary | ICD-10-CM | POA: Diagnosis not present

## 2020-07-25 DIAGNOSIS — K51919 Ulcerative colitis, unspecified with unspecified complications: Secondary | ICD-10-CM | POA: Diagnosis not present

## 2020-07-25 DIAGNOSIS — R112 Nausea with vomiting, unspecified: Secondary | ICD-10-CM

## 2020-07-25 LAB — IBC PANEL
Iron: 19 ug/dL — ABNORMAL LOW (ref 42–145)
Saturation Ratios: 7.6 % — ABNORMAL LOW (ref 20.0–50.0)
Transferrin: 178 mg/dL — ABNORMAL LOW (ref 212.0–360.0)

## 2020-07-25 LAB — FERRITIN: Ferritin: 67.8 ng/mL (ref 10.0–291.0)

## 2020-07-25 LAB — B12 AND FOLATE PANEL
Folate: 13.3 ng/mL (ref >5.9)
Vitamin B-12: 628 pg/mL (ref 211–911)

## 2020-07-25 MED ORDER — DICYCLOMINE HCL 10 MG PO CAPS: 10.0000 mg | ORAL_CAPSULE | Freq: Three times a day (TID) | ORAL | 2 refills | Status: DC

## 2020-07-25 MED ORDER — PANTOPRAZOLE SODIUM 40 MG PO TBEC: 40.0000 mg | DELAYED_RELEASE_TABLET | Freq: Every day | ORAL | 2 refills | Status: DC

## 2020-07-25 MED ORDER — ONDANSETRON HCL 4 MG PO TABS: 4.0000 mg | ORAL_TABLET | Freq: Four times a day (QID) | ORAL | 1 refills | Status: DC | PRN

## 2020-07-25 MED ORDER — PREDNISONE 20 MG PO TABS: ORAL_TABLET | ORAL | 0 refills | Status: AC

## 2020-07-25 NOTE — Progress Notes (Signed)
Subjective:    Patient ID: Robin Garrison, female    DOB: 04-22-2000, 20 y.o.   MRN: [id number]  HPI Robin is a 20 year old African-American female, established with Dr. Silverio Decamp, who was last seen here in May 2021.  She has history of ulcerative colitis which was diagnosed at age 21.  She initially established care last May.  She underwent colonoscopy in May 2021 with finding of active colitis from the anus to the hepatic flexure the ascending colon and cecum were spared.  Biopsies were consistent with active ulcerative colitis. At that time she was treated with a tapered course of steroids, and was prescribed sulfasalazine 1 g twice daily and folic acid 1 mg daily which she had previously been on. Patient tells me now that she went into remission shortly after the colonoscopy and stopped her medications.  She did well until about 2 months ago when symptoms flared again.  She says her symptoms are severe currently and she had an ER visit on 07/21/2020 with complaints of abdominal pain and cramping and rectal bleeding intermittent vomiting.  She had an office visit here on 4 5 and no showed. CT of the abdomen and pelvis on 07/21/2020 showed a long segment of mucosal enhancement and submucosal edema extending from the distal rectum to the hepatic flexure of the transverse colon consistent with active colitis no fistula or abscess remainder of small bowel and colon normal. Labs with normal WBC, hemoglobin 12.2, potassium 3.4 LFTs within normal limits, beta-hCG negative Sed rate 28 CRP 6.1  She was started on prednisone at 60 mg p.o. daily.  Now on further questioning she says that she had actually started herself on an old prescription of prednisone and had been taking about 40 mg over the past 3 to 4 weeks.  Thus far on 60 mg of prednisone of the past few days she has not had any improvement in symptoms.  She is complaining of ongoing abdominal pain and cramping with numerous bowel movements per day.   She says she thinks sometimes she has more than 20 bowel movements per day, frequently these are small volume squirts of stool in all of these contain blood.  She does complain cramping in the mid abdomen worse after eating.  She says she is having a difficult time eating and frequently will get nauseated with the pain and has been intermittently vomiting.  Today she says she tried to keep down strawberries and yogurt and eventually vomited.  She is hungry No fever or chills. No recent antibiotics. She and her mom also mentioned that when she tried to resume sulfasalazine she developed burning and itching of her skin, thinks she is intolerant to it and stopped it. Review of Systems Pertinent positive and negative review of systems were noted in the above HPI section.  All other review of systems was otherwise negative.  Outpatient Encounter Medications as of 07/25/2020  Medication Sig  . dicyclomine (BENTYL) 10 MG capsule Take 1 capsule (10 mg total) by mouth 4 (four) times daily -  before meals and at bedtime.  . folic acid (FOLVITE) 1 MG tablet Take 1 tablet (1 mg total) by mouth daily.  . norgestimate-ethinyl estradiol (ORTHO-CYCLEN) 0.25-35 MG-MCG tablet Take 1 tablet by mouth daily.  . ondansetron (ZOFRAN) 4 MG tablet Take 1 tablet (4 mg total) by mouth every 6 (six) hours as needed for nausea or vomiting.  . pantoprazole (PROTONIX) 40 MG tablet Take 1 tablet (40 mg total) by mouth daily.  . [  DISCONTINUED] predniSONE (DELTASONE) 20 MG tablet Take 60 mg daily for 2 weeks followed by 50 mg daily for 2 weeks.  . predniSONE (DELTASONE) 20 MG tablet Take 3 tablets (60 mg total) by mouth in the morning for 14 days, THEN 2 tablets (40 mg total) in the morning for 14 days, THEN 1.5 tablets (30 mg total) in the morning for 14 days, THEN 1 tablet (20 mg total) in the morning for 14 days.  . [DISCONTINUED] doxycycline (VIBRAMYCIN) 100 MG capsule Take 1 capsule (100 mg total) by mouth 2 (two) times daily.  .  [DISCONTINUED] metroNIDAZOLE (FLAGYL) 500 MG tablet Take 1 tablet (500 mg total) by mouth 2 (two) times daily.   No facility-administered encounter medications on file as of 07/25/2020.   Allergies  Allergen Reactions  . Lactose Intolerance (Gi) Other (See Comments)    Upset stomach  . Sulfa Antibiotics Itching and Swelling    Burning    Patient Active Problem List   Diagnosis Date Noted  . Possible exposure to STD 06/05/2020  . Vitamin D deficiency 12/28/2014  . 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 Excessive gas 11/24/2012  . Ulcerative colitis without complications [facility])    Social History   Socioeconomic History  . Marital status: Single    Spouse name: Not on file  . Number of children: 1  . Years of education: Not on file  . Highest education level: Not on file  Occupational History  . Occupation: [employer]  Tobacco Use  . Smoking status: Never Smoker  . Smokeless tobacco: Never Used  Vaping Use  . Vaping Use: Never used  Substance and Sexual Activity  . Alcohol use: No    Alcohol/week: 0.0 standard drinks  . Drug use: No  . Sexual activity: Yes    Partners: Male    Birth control/protection: Pill  Other Topics Concern  . Not on file  Social History Narrative   8th grade   Social Determinants of Health   Financial Resource Strain: Not on file  Food Insecurity: Not on file  Transportation Needs: Not on file  Physical Activity: Not on file  Stress: Not on file  Social Connections: Not on file  Intimate Partner Violence: Not on file    Ms. Spells family history includes Diabetes in her paternal aunt, paternal grandmother, and another family member; Healthy in her father; Heart disease in her maternal grandfather and maternal uncle; Hypertension in her mother; Irritable bowel syndrome in her maternal grandmother; Other in her maternal grandmother; Vision loss in her paternal grandmother.       Objective:    Vitals:   07/25/20 1428  BP: 110/60  Pulse: 80    Physical Exam Well-developed well-nourished African-American female in no acute distress.  Height, Weight 142, BMI 26.0 accompanied by her mother  HEENT; nontraumatic normocephalic, EOMI, PE R LA, sclera anicteric. Oropharynx; whitish coating on the tongue Neck; supple, no JVD Cardiovascular; regular rate and rhythm with S1-S2, no murmur rub or gallop Pulmonary; Clear bilaterally Abdomen; soft, there is mild tenderness in the epigastrium and left lower quadrant no rebound, nondistended, no palpable mass or hepatosplenomegaly, bowel sounds are active Rectal; not done today Skin; benign exam, no jaundice rash or appreciable lesions Extremities; no clubbing cyanosis or edema skin warm and dry Neuro/Psych; alert and oriented x4, grossly nonfocal mood and affect appropriate       Assessment & Plan:   #22 21 year old African-American female with long history  of ulcerative colitis initially diagnosed at age 69, who comes in now after not being seen since May 2021, with an exacerbation of UC with numerous bowel movements per day, diarrhea, rectal bleeding, abdominal pain and cramping and intermittent nausea and vomiting.  CT of the abdomen earlier this week did show a long segment of active ulcerative colitis from the distal rectum to the hepatic flexure.  She is currently on prednisone 60 mg daily over the past few days with no improvement in symptoms as yet.  #2 recent development of itching and burning of the skin with sulfasalazine-stopped #3 probable oral thrush #4 GERD symptoms with recurrent nausea and vomiting, rule out esophagitis  Plan; we discussed soft bland diet and pushing fluids Continue prednisone 60 mg p.o. every morning x2 weeks then decrease to 40 mg p.o. every morning x2 weeks, then decrease to 30 mg p.o. every morning x2 weeks then decrease to 20 mg p.o. every morning x2 weeks then decrease to 10 mg  p.o. every morning x2 weeks.  Add trial of Zofran 4 mg every 6 hours as needed for nausea Start trial of Protonix 40 mg p.o. every morning Mycostatin oral suspension swish and spit 4 times daily x2 weeks Start Bentyl 10 mg p.o. 4 times daily AC for cramping and urgency We will check B12, folate, iron studies, hepatitis serologies and QuantiFERON gold Patient and mom are interested in initiating biologic therapy.  Will initiate approval process for Remicade or Humira.  Patient says she would prefer to have infusion.  She has follow-up with Dr. Silverio Decamp  in about 3 weeks, and pending approval for Biologics further discussions can be had at that time regarding biologic therapy.  Robin Molinari S Venesa Semidey PA-C 07/25/2020   Cc: No ref. provider found

## 2020-07-25 NOTE — Patient Instructions (Addendum)
If you are age 22 or older, your body mass index should be between 23-30. Your Body mass index is 26.02 kg/m. If this is out of the aforementioned range listed, please consider follow up with your Primary Care Provider.  If you are age 64 or younger, your body mass index should be between 19-25. Your Body mass index is 26.02 kg/m. If this is out of the aformentioned range listed, please consider follow up with your Primary Care Provider.   Your provider has requested that you go to the basement level for lab work before leaving today. Press "B" on the elevator. The lab is located at the first door on the left as you exit the elevator.  Start tapering down your Prednisone. START Pantoprazole 40 mg 1 tablet every morning. START Dicyclomine 10 mg 1 tablet every 4 times daily as needed for abdominal pain START Ondansetron 4 mg 1 tablet every 6 hours as needed for nausea/vomiting  Keep you appointment scheduled with Dr. Silverio Decamp scheduled for Aug 21, 2020 at 1:50 pm  Thank you for entrusting me with your care and choosing [facility].  Amy Esterwood, PA-C

## 2020-07-26 LAB — HEPATITIS B SURFACE ANTIBODY,QUALITATIVE: Hep B S Ab: NONREACTIVE

## 2020-07-26 LAB — HEPATITIS C ANTIBODY
Hepatitis C Ab: NONREACTIVE
SIGNAL TO CUT-OFF: 0.01 (ref <1.00)

## 2020-07-26 LAB — HEPATITIS B SURFACE ANTIGEN: Hepatitis B Surface Ag: NONREACTIVE

## 2020-07-27 LAB — QUANTIFERON-TB GOLD PLUS
Mitogen-NIL: >10 IU/mL
NIL: 0.02 IU/mL
QuantiFERON-TB Gold Plus: NEGATIVE
TB1-NIL: 0 IU/mL
TB2-NIL: 0 IU/mL

## 2020-07-29 ENCOUNTER — Other Ambulatory Visit: Payer: Self-pay

## 2020-07-29 DIAGNOSIS — Z30011 Encounter for initial prescription of contraceptive pills: Secondary | ICD-10-CM

## 2020-07-29 MED ORDER — NORGESTIMATE-ETH ESTRADIOL 0.25-35 MG-MCG PO TABS: 1.0000 | ORAL_TABLET | Freq: Every day | ORAL | 11 refills | Status: DC

## 2020-08-09 ENCOUNTER — Ambulatory Visit (INDEPENDENT_AMBULATORY_CARE_PROVIDER_SITE_OTHER): Payer: Medicaid Other

## 2020-08-09 ENCOUNTER — Other Ambulatory Visit (HOSPITAL_COMMUNITY)
Admission: RE | Admit: 2020-08-09 | Discharge: 2020-08-09 | Disposition: A | Discharge status: Home / Self Care | Payer: Medicaid Other | Source: Ambulatory Visit | Attending: Obstetrics & Gynecology | Admitting: Obstetrics & Gynecology

## 2020-08-09 ENCOUNTER — Other Ambulatory Visit: Payer: Self-pay

## 2020-08-09 DIAGNOSIS — A749 Chlamydial infection, unspecified: Secondary | ICD-10-CM | POA: Insufficient documentation

## 2020-08-09 DIAGNOSIS — N898 Other specified noninflammatory disorders of vagina: Secondary | ICD-10-CM

## 2020-08-09 NOTE — Progress Notes (Signed)
Patient was assessed and managed by nursing staff during this encounter. I have reviewed the chart and agree with the documentation and plan. I have also made any necessary editorial changes.  Verita Schneiders, MD 08/09/2020 11:39 AM

## 2020-08-09 NOTE — Progress Notes (Signed)
SUBJECTIVE:  20 y.o. female presents for TOC CT. Pt completed treatment and reports partner was treated as well. She complains of brown malodorus vaginal discharge.  Denies IC since +CT. No UTI symptoms.   Patient's last menstrual period was 07/22/2020.  OBJECTIVE:  She appears well, afebrile. Urine dipstick: not done.  ASSESSMENT:  Vaginal Discharge  Vaginal Odor   PLAN:  GC, chlamydia, trichomonas, BVAG, CVAG probe sent to lab. Treatment: To be determined once lab results are reviewed by the provider  ROV prn if symptoms persist or worsen.

## 2020-08-12 ENCOUNTER — Other Ambulatory Visit: Payer: Self-pay

## 2020-08-12 ENCOUNTER — Other Ambulatory Visit: Payer: Self-pay | Admitting: Obstetrics & Gynecology

## 2020-08-12 DIAGNOSIS — B9689 Other specified bacterial agents as the cause of diseases classified elsewhere: Secondary | ICD-10-CM

## 2020-08-12 DIAGNOSIS — N760 Acute vaginitis: Secondary | ICD-10-CM

## 2020-08-12 LAB — CERVICOVAGINAL ANCILLARY ONLY
Bacterial Vaginitis (gardnerella): POSITIVE — AB
Candida Glabrata: NEGATIVE
Candida Vaginitis: NEGATIVE
Chlamydia: NEGATIVE
Comment: NEGATIVE
Comment: NEGATIVE
Comment: NEGATIVE
Comment: NEGATIVE
Comment: NEGATIVE
Comment: NORMAL
Neisseria Gonorrhea: NEGATIVE
Trichomonas: NEGATIVE

## 2020-08-12 MED ORDER — METRONIDAZOLE 500 MG PO TABS: 500.0000 mg | ORAL_TABLET | Freq: Two times a day (BID) | ORAL | 0 refills | Status: AC

## 2020-08-12 NOTE — Progress Notes (Signed)
Reviewed and agree with documentation and assessment and plan. K. Veena Juliano Mceachin , MD   

## 2020-08-21 ENCOUNTER — Ambulatory Visit: Payer: Medicaid Other | Admitting: Gastroenterology

## 2020-08-26 ENCOUNTER — Ambulatory Visit: Payer: Medicaid Other

## 2020-08-27 ENCOUNTER — Ambulatory Visit (INDEPENDENT_AMBULATORY_CARE_PROVIDER_SITE_OTHER): Payer: Medicaid Other

## 2020-08-27 ENCOUNTER — Other Ambulatory Visit: Payer: Self-pay

## 2020-08-27 DIAGNOSIS — N926 Irregular menstruation, unspecified: Secondary | ICD-10-CM

## 2020-08-27 LAB — POCT URINE PREGNANCY: Preg Test, Ur: NEGATIVE

## 2020-08-27 NOTE — Progress Notes (Signed)
Robin Garrison presents today for UPT. She has no unusual complaints. LMP: 07/29/20    OBJECTIVE: Appears well, in no apparent distress.  OB History    Gravida  1   Para  1   Term  1   Preterm      AB      Living  1     SAB      IAB      Ectopic      Multiple  0   Live Births  1          Home UPT Result: Negative In-Office UPT result: Negative I have reviewed the patient's medical, obstetrical, social, and family histories, and medications.   ASSESSMENT: Negative pregnancy test  PLAN Patient states that she is taking ocp and has been spotting for about 4 weeks. She states that she is taking her pills daily. Patient advised to take pills at the same tome daily, and to wait for next cycle. Advised if she continues to feel pregnancy related sx to take another HPT. Patient verbalized understanding.

## 2020-08-28 IMAGING — CT CT ABD-PELV W/ CM
2 of 4 series · 16 of 46 positions shown, 18 images · IV contrast (OMNIPAQUE 300)
Comparison: None.

CLINICAL DATA: History of ulcerative colitis. Rectal bleeding and
diarrhea

EXAM:
CT ABDOMEN AND PELVIS WITH CONTRAST
TECHNIQUE: Multidetector CT imaging of the abdomen and pelvis was performed
using the standard protocol following bolus administration of
intravenous contrast.
CONTRAST:  100mL OMNIPAQUE IOHEXOL 300 MG/ML  SOLN

[Series 2: axial st · axial · 0.69mm/px · z∈[+1119,+1509]mm · 13 of 88 slices shown, 15 images]
[im 5/88  soft-tissue]
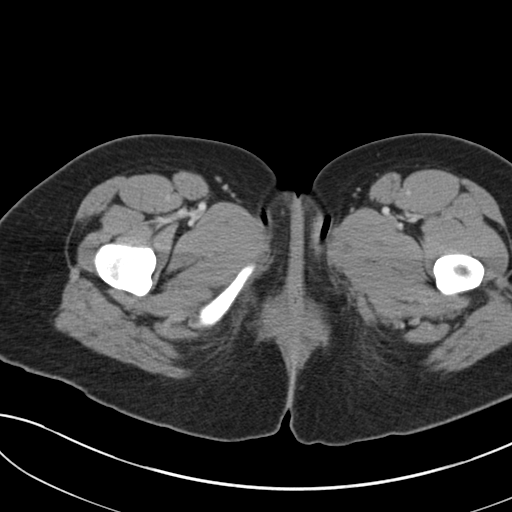
[im 5/88  bone]
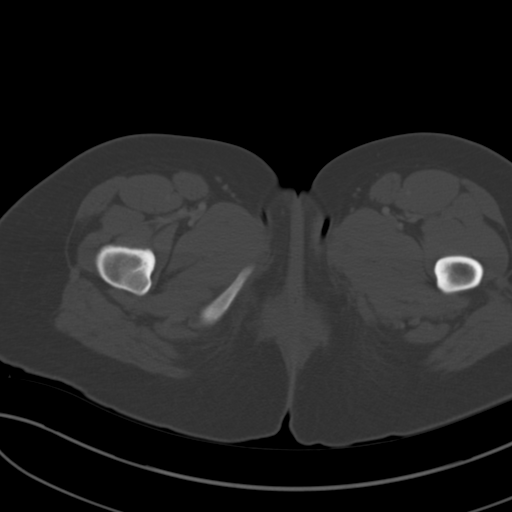
[im 14/88  soft-tissue]
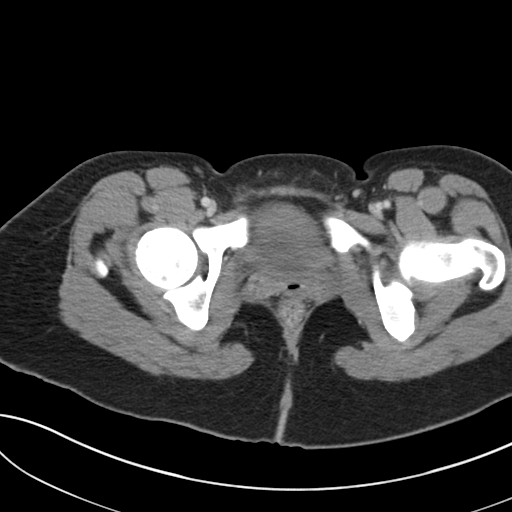
[im 19/88  soft-tissue]
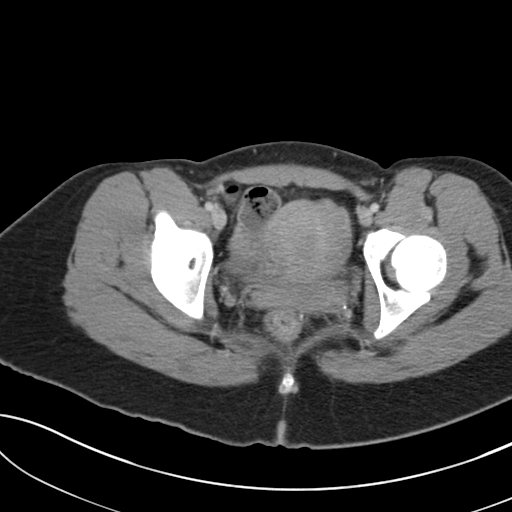
[im 23/88  soft-tissue]
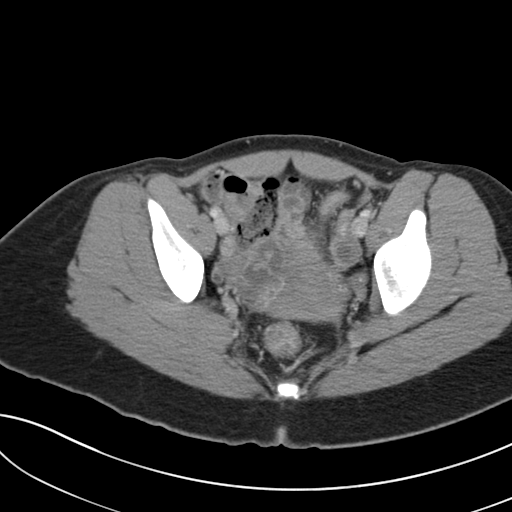
[im 33/88  soft-tissue]
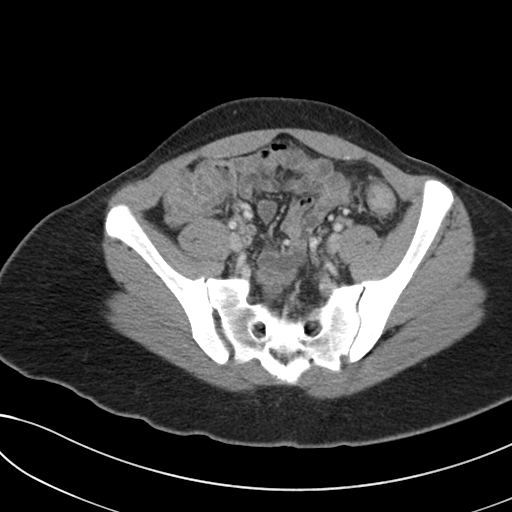
[im 37/88  soft-tissue]
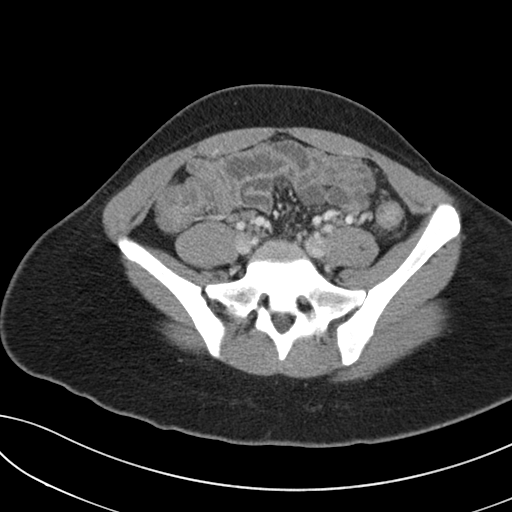
[im 46/88  soft-tissue]
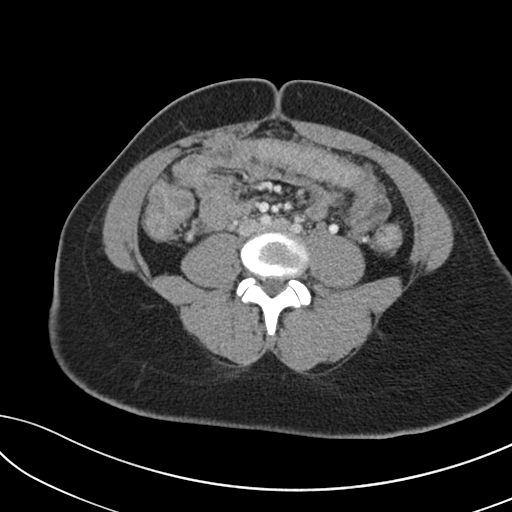
[im 51/88  soft-tissue]
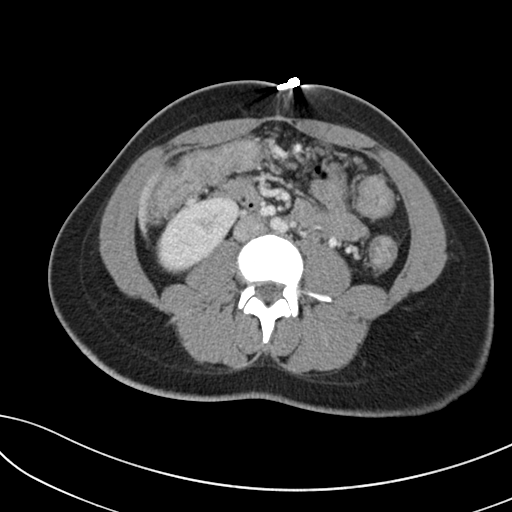
[im 55/88  soft-tissue]
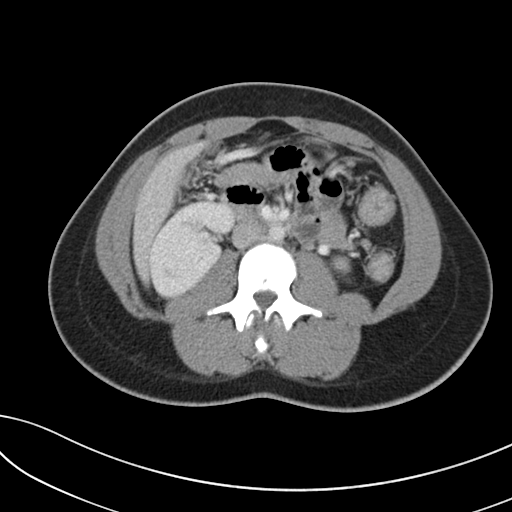
[im 55/88  bone]
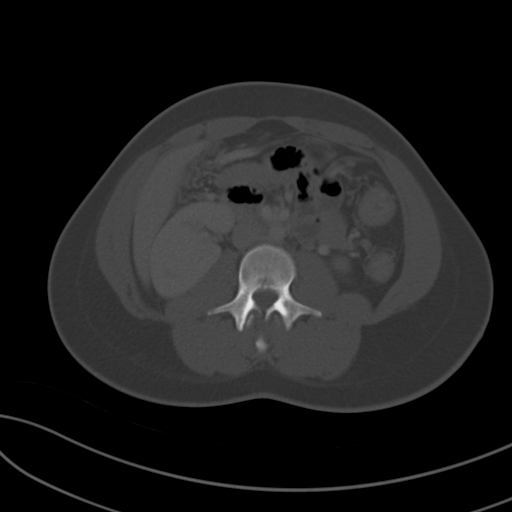
[im 65/88  soft-tissue]
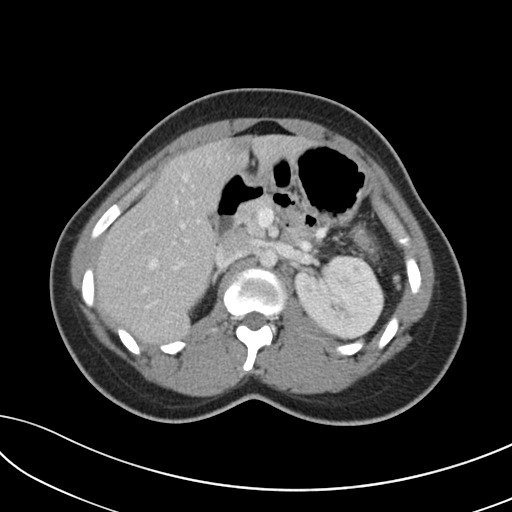
[im 69/88  soft-tissue]
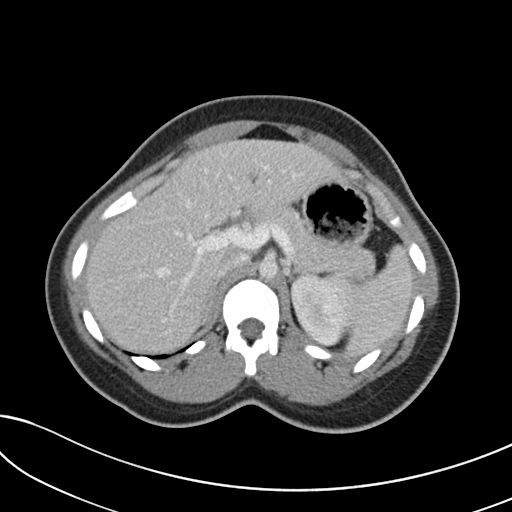
[im 74/88  soft-tissue]
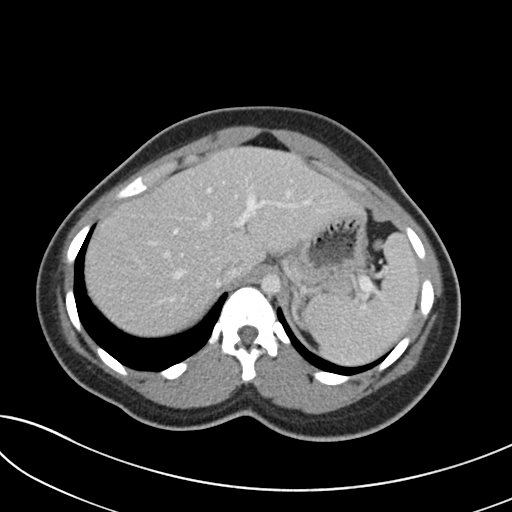
[im 83/88  soft-tissue]
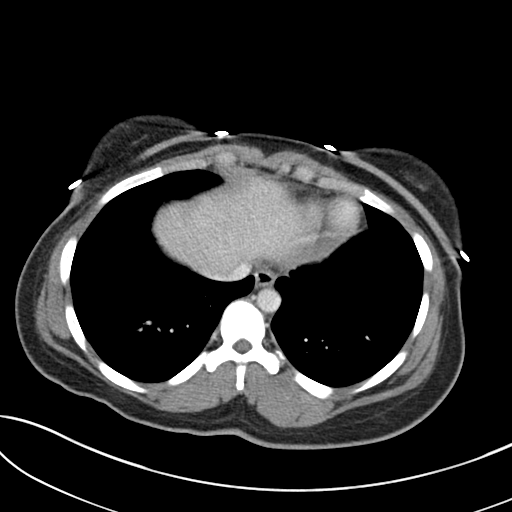

[Series 5: coronal st · coronal · 0.69mm/px · 3 of 125 slices shown]
[im 42/125  soft-tissue]
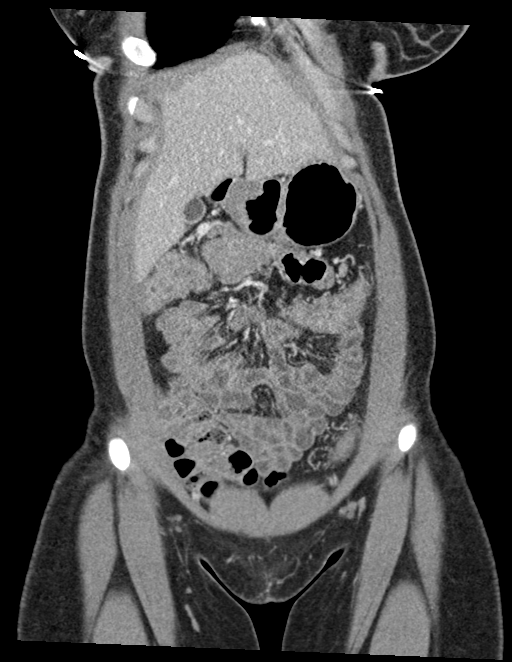
[im 56/125  soft-tissue]
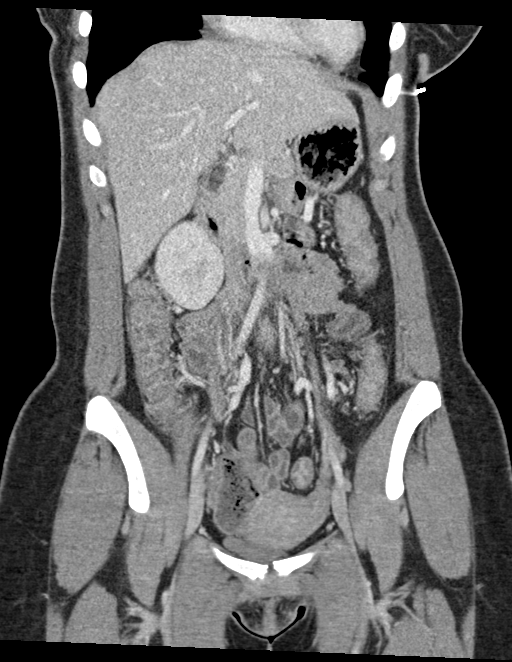
[im 69/125  soft-tissue]
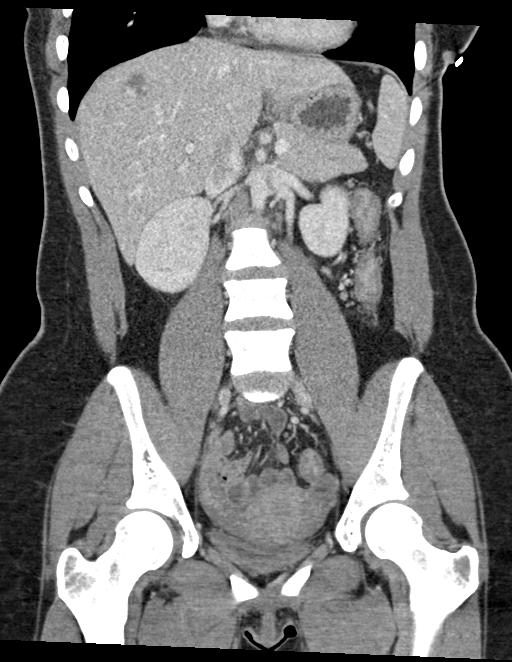

[16 of 46 positions shown; findings below may reference images not displayed]

FINDINGS: Lower chest: No acute abnormality.

Hepatobiliary: Lesion within segment 7 is identified measuring
cm, image [DATE]. This contains peripheral areas of nodular
enhancement favoring a benign hemangioma. Gallbladder appears
normal. No biliary ductal dilatation.

Pancreas: Unremarkable. No pancreatic ductal dilatation or
surrounding inflammatory changes.

Spleen: Normal in size without focal abnormality.

Adrenals/Urinary Tract: Normal adrenal glands. The kidneys are
unremarkable. No mass or hydronephrosis. Urinary bladder appears
normal for degree of distention.

Stomach/Bowel: Stomach is nondistended. Small bowel loops have a
normal caliber. No small bowel wall thickening, inflammation or
distension. There is diffuse colonic wall edema, mucosal enhancement
and Peri colonic hyperemia compatible with pancolitis. There is no
pneumatosis, evidence of bowel perforation, or bowel distension.

Vascular/Lymphatic: No significant vascular findings are present. No
enlarged abdominal or pelvic lymph nodes.

Reproductive: Uterus and bilateral adnexa are unremarkable.

Other: No free fluid, or fluid collections.

Musculoskeletal: No acute or significant osseous findings.
IMPRESSION: 1. Findings compatible with pancolitis. No evidence for bowel
perforation, abscess or bowel obstruction.
2. Solitary liver lesion is identified favoring benign hemangioma.
Advise follow-up imaging with abdominal sonogram in 6 months.

## 2020-09-10 ENCOUNTER — Ambulatory Visit: Payer: Self-pay

## 2020-09-10 NOTE — Telephone Encounter (Signed)
Pt. Concerned she is pregnant. States her doctor did a pregnancy test and it was negative. She did a home test and it is negative as well. Last month spotted x 4 weeks, dark "blood." Instructed to call her doctor's office. Having mild abdominal discomfort.  Reason for Disposition . [1] Menstrual cycle < 21 days OR > 35 days AND [2] occurs more than two cycles (2 months) this past year  Answer Assessment - Initial Assessment Questions 1. AMOUNT: "Describe the bleeding that you are having."    - SPOTTING: spotting, or pinkish / brownish mucous discharge; does not fill panty liner or pad    - MILD:  less than 1 pad / hour; less than patient's usual menstrual bleeding   - MODERATE: 1-2 pads / hour; 1 menstrual cup every 6 hours; small-medium blood clots (e.g., pea, grape, small coin)   - SEVERE: soaking 2 or more pads/hour for 2 or more hours; 1 menstrual cup every 2 hours; bleeding not contained by pads or continuous red blood from vagina; large blood clots (e.g., golf ball, large coin)      Last month spotted x 4 weeks. Dark red blood 2. ONSET: "When did the bleeding begin?" "Is it continuing now?"     Last month 3. MENSTRUAL PERIOD: "When was the last normal menstrual period?" "How is this different than your period?"     2 months ago 4. REGULARITY: "How regular are your periods?"     On birth control 5. ABDOMINAL PAIN: "Do you have any pain?" "How bad is the pain?"  (e.g., Scale 1-10; mild, moderate, or severe)   - MILD (1-3): doesn't interfere with normal activities, abdomen soft and not tender to touch    - MODERATE (4-7): interferes with normal activities or awakens from sleep, abdomen tender to touch    - SEVERE (8-10): excruciating pain, doubled over, unable to do any normal activities      Mild 6. PREGNANCY: "Could you be pregnant?" "Are you sexually active?" "Did you recently give birth?"     Pt. Feels like she is pregnant 7. BREASTFEEDING: "Are you breastfeeding?"     No 8.  HORMONES: "Are you taking any hormone medications, prescription or OTC?" (e.g., birth control pills, estrogen)     Birth control pills 9. BLOOD THINNERS: "Do you take any blood thinners?" (e.g., Coumadin/warfarin, Pradaxa/dabigatran, aspirin)     No 10. CAUSE: "What do you think is causing the bleeding?" (e.g., recent gyn surgery, recent gyn procedure; known bleeding disorder, cervical cancer, polycystic ovarian disease, fibroids)         Unsure 11. HEMODYNAMIC STATUS: "Are you weak or feeling lightheaded?" If Yes, ask: "Can you stand and walk normally?"        No 12. OTHER SYMPTOMS: "What other symptoms are you having with the bleeding?" (e.g., passed tissue, vaginal discharge, fever, menstrual-type cramps)       No  Protocols used: VAGINAL BLEEDING - ABNORMAL-A-AH

## 2020-09-13 ENCOUNTER — Other Ambulatory Visit (HOSPITAL_COMMUNITY)
Admission: RE | Admit: 2020-09-13 | Discharge: 2020-09-13 | Disposition: A | Discharge status: Home / Self Care | Payer: Medicaid Other | Source: Ambulatory Visit | Attending: Obstetrics | Admitting: Obstetrics

## 2020-09-13 ENCOUNTER — Encounter: Payer: Self-pay | Admitting: Obstetrics

## 2020-09-13 ENCOUNTER — Ambulatory Visit (INDEPENDENT_AMBULATORY_CARE_PROVIDER_SITE_OTHER): Payer: Medicaid Other | Admitting: Obstetrics

## 2020-09-13 ENCOUNTER — Other Ambulatory Visit: Payer: Self-pay

## 2020-09-13 VITALS — BP 128/90 | HR 80 | Wt 150.2 lb

## 2020-09-13 DIAGNOSIS — N898 Other specified noninflammatory disorders of vagina: Secondary | ICD-10-CM | POA: Diagnosis not present

## 2020-09-13 DIAGNOSIS — R102 Pelvic and perineal pain: Secondary | ICD-10-CM

## 2020-09-13 DIAGNOSIS — N939 Abnormal uterine and vaginal bleeding, unspecified: Secondary | ICD-10-CM | POA: Diagnosis not present

## 2020-09-13 DIAGNOSIS — N946 Dysmenorrhea, unspecified: Secondary | ICD-10-CM | POA: Diagnosis not present

## 2020-09-13 DIAGNOSIS — Z113 Encounter for screening for infections with a predominantly sexual mode of transmission: Secondary | ICD-10-CM | POA: Diagnosis not present

## 2020-09-13 NOTE — Progress Notes (Signed)
Pt thinks she is pregnancy.  She reports low abdominal pain and abnormal vaginal bleeding. She took multiple home UPT all negative Pt is requesting an ultrasound to rule out pregnancy.  Pt unable to void

## 2020-09-13 NOTE — Progress Notes (Signed)
Patient ID: Robin I Garden, female   DOB: 2000/09/24, 20 y.o.   MRN: [id number]  Chief Complaint  Patient presents with  . Vaginal Bleeding    HPI Robin Garrison is a 20 y.o. female.  Complains of pelvic pain and painful, irregular periods.  She also states that she has had breast tenderness and bloating, and feels like she is pregnant, but has had negative pregnancy tests at home.  On OCP's and has not missed any pills. HPI  Past Medical History:  Diagnosis Date  . Chest pain 03/06/2013   Saw [facility] for CP on exertion in cold weather; cleared for sports, no restrictions  . Chest pain on exertion 03/06/2013  . Cholecystitis   . Colitis   . Proctitis   . Proctitis     Past Surgical History:  Procedure Laterality Date  . EYE SURGERY Bilateral    age 35  . TONSILECTOMY/ADENOIDECTOMY WITH MYRINGOTOMY      Family History  Problem Relation Age of Onset  . Hypertension Mother   . Healthy Father   . Other Maternal Grandmother        stomach problems  . Irritable bowel syndrome Maternal Grandmother   . Diabetes Other   . Diabetes Paternal Aunt   . Vision loss Paternal Grandmother   . Diabetes Paternal Grandmother   . Heart disease Maternal Grandfather   . Heart disease Maternal Uncle   . Inflammatory bowel disease Neg Hx     Social History Social History   Tobacco Use  . Smoking status: Never Smoker  . Smokeless tobacco: Never Used  Vaping Use  . Vaping Use: Never used  Substance Use Topics  . Alcohol use: No    Alcohol/week: 0.0 standard drinks  . Drug use: No    Allergies  Allergen Reactions  . Lactose Intolerance (Gi) Other (See Comments)    Upset stomach  . Sulfa Antibiotics Itching and Swelling    Burning   . Ibuprofen     Hemorrhage per pt     Current Outpatient Medications  Medication Sig Dispense Refill  . norgestimate-ethinyl estradiol (ORTHO-CYCLEN) 0.25-35 MG-MCG tablet Take 1 tablet by mouth daily. 28 tablet 11  . dicyclomine (BENTYL)  10 MG capsule Take 1 capsule (10 mg total) by mouth 4 (four) times daily -  before meals and at bedtime. (Patient not taking: Reported on 09/13/2020) 120 capsule 2  . ondansetron (ZOFRAN) 4 MG tablet Take 1 tablet (4 mg total) by mouth every 6 (six) hours as needed for nausea or vomiting. (Patient not taking: Reported on 09/13/2020) 40 tablet 1  . pantoprazole (PROTONIX) 40 MG tablet Take 1 tablet (40 mg total) by mouth daily. (Patient not taking: Reported on 09/13/2020) 30 tablet 2  . predniSONE (DELTASONE) 20 MG tablet Take 3 tablets (60 mg total) by mouth in the morning for 14 days, THEN 2 tablets (40 mg total) in the morning for 14 days, THEN 1.5 tablets (30 mg total) in the morning for 14 days, THEN 1 tablet (20 mg total) in the morning for 14 days. (Patient not taking: Reported on 09/13/2020) 105 tablet 0   No current facility-administered medications for this visit.    Review of Systems Review of Systems Constitutional: negative for fatigue and weight loss Respiratory: negative for cough and wheezing Cardiovascular: negative for chest pain, fatigue and palpitations Gastrointestinal: negative for abdominal pain and change in bowel habits Genitourinary: positive for irregular painful periods Integument/breast: negative for nipple discharge Musculoskeletal:negative for myalgias  Neurological: negative for gait problems and tremors Behavioral/Psych: negative for abusive relationship, depression Endocrine: negative for temperature intolerance      Blood pressure 128/90, pulse 80, weight 150 lb 3.2 oz (68.1 kg), last menstrual period 09/09/2020, not currently breastfeeding.  Physical Exam Physical Exam General:   alert and no distress  Skin:   no rash or abnormalities  Lungs:   clear to auscultation bilaterally  Heart:   regular rate and rhythm, S1, S2 normal, no murmur, click, rub or gallop  Breasts:   not examined  Abdomen:  normal findings: no organomegaly, soft, non-tender and no hernia   Pelvis:  External genitalia: normal general appearance Urinary system: urethral meatus normal and bladder without fullness, nontender Vaginal: normal without tenderness, induration or masses Cervix: normal appearance Adnexa: normal bimanual exam Uterus: anteverted and non-tender, normal size        Data Reviewed Wet Prep Notes from ER  Assessment     1. Abnormal uterine bleeding (AUB) Rx: - POCT urine pregnancy  2. Pelvic pain Rx: - US PELVIC COMPLETE WITH TRANSVAGINAL; Future  3. Dysmenorrhea - cannot take NSAIDS because of colitis - Tylenol prn  4. Vaginal discharge Rx: - Cervicovaginal ancillary only  5. Screening for STD (sexually transmitted disease) Rx: - Hepatitis B surface antigen - Hepatitis C antibody - HIV Antibody (routine testing w rflx) - RPR     Plan   Follow up in 3 weeks  Orders Placed This Encounter  Procedures  . US PELVIC COMPLETE WITH TRANSVAGINAL    Standing Status:   Future    Standing Expiration Date:   09/13/2021    Order Specific Question:   Reason for Exam (SYMPTOM  OR DIAGNOSIS REQUIRED)    Answer:   Pelvic pain.  AUB.    Order Specific Question:   Preferred imaging location?    Answer:   [facility]  . Hepatitis B surface antigen  . Hepatitis C antibody  . HIV Antibody (routine testing w rflx)  . RPR  . POCT urine pregnancy   I have spent a total of 20 minutes of face-to-face time, excluding clinical staff time, reviewing notes and preparing to see patient, ordering tests and/or medications, and counseling the patient.   Shelly Bombard, MD 09/13/2020 10:29 AM

## 2020-09-14 LAB — RPR: RPR Ser Ql: NONREACTIVE

## 2020-09-14 LAB — HEPATITIS B SURFACE ANTIGEN: Hepatitis B Surface Ag: NEGATIVE

## 2020-09-14 LAB — HEPATITIS C ANTIBODY: Hep C Virus Ab: <0.1 s/co ratio (ref 0.0–0.9)

## 2020-09-14 LAB — HIV ANTIBODY (ROUTINE TESTING W REFLEX): HIV Screen 4th Generation wRfx: NONREACTIVE

## 2020-09-17 LAB — CERVICOVAGINAL ANCILLARY ONLY
Bacterial Vaginitis (gardnerella): POSITIVE — AB
Candida Glabrata: NEGATIVE
Candida Vaginitis: NEGATIVE
Chlamydia: NEGATIVE
Comment: NEGATIVE
Comment: NEGATIVE
Comment: NEGATIVE
Comment: NEGATIVE
Comment: NEGATIVE
Comment: NORMAL
Neisseria Gonorrhea: NEGATIVE
Trichomonas: NEGATIVE

## 2020-09-18 ENCOUNTER — Other Ambulatory Visit: Payer: Self-pay | Admitting: Obstetrics

## 2020-09-18 DIAGNOSIS — B9689 Other specified bacterial agents as the cause of diseases classified elsewhere: Secondary | ICD-10-CM

## 2020-09-18 DIAGNOSIS — N760 Acute vaginitis: Secondary | ICD-10-CM

## 2020-09-18 MED ORDER — METRONIDAZOLE 500 MG PO TABS
500.0000 mg | ORAL_TABLET | Freq: Two times a day (BID) | ORAL | 2 refills | Status: DC
Start: 2020-09-18 — End: 2020-09-27

## 2020-09-24 ENCOUNTER — Ambulatory Visit: Payer: Medicaid Other | Admitting: Gastroenterology

## 2020-09-27 ENCOUNTER — Ambulatory Visit (HOSPITAL_COMMUNITY): Admission: RE | Admit: 2020-09-27 | Payer: Medicaid Other | Source: Ambulatory Visit

## 2020-09-27 ENCOUNTER — Encounter: Payer: Self-pay | Admitting: Obstetrics

## 2020-09-27 ENCOUNTER — Telehealth (INDEPENDENT_AMBULATORY_CARE_PROVIDER_SITE_OTHER): Payer: Medicaid Other | Admitting: Obstetrics

## 2020-09-27 DIAGNOSIS — N939 Abnormal uterine and vaginal bleeding, unspecified: Secondary | ICD-10-CM

## 2020-09-27 DIAGNOSIS — N760 Acute vaginitis: Secondary | ICD-10-CM | POA: Diagnosis not present

## 2020-09-27 DIAGNOSIS — N946 Dysmenorrhea, unspecified: Secondary | ICD-10-CM

## 2020-09-27 DIAGNOSIS — B9689 Other specified bacterial agents as the cause of diseases classified elsewhere: Secondary | ICD-10-CM | POA: Diagnosis not present

## 2020-09-27 MED ORDER — METRONIDAZOLE 0.75 % VA GEL: 1.0000 | Freq: Two times a day (BID) | VAGINAL | 5 refills | Status: DC

## 2020-09-27 NOTE — Progress Notes (Signed)
Subjective:        Robin Garrison is a 20 y.o. female here for a routine exam.  Current complaints: Irregular, painful periods.    Personal health questionnaire:  Is patient Ashkenazi Jewish, have a family history of breast and/or ovarian cancer: no Is there a family history of uterine cancer diagnosed at age < 53, gastrointestinal cancer, urinary tract cancer, family member who is a Field seismologist syndrome-associated carrier: no Is the patient overweight and hypertensive, family history of diabetes, personal history of gestational diabetes, preeclampsia or PCOS: no Is patient over 51, have PCOS,  family history of premature CHD under age 39, diabetes, smoke, have hypertension or peripheral artery disease:  no At any time, has a partner hit, kicked or otherwise hurt or frightened you?: no Over the past 2 weeks, have you felt down, depressed or hopeless?: no Over the past 2 weeks, have you felt little interest or pleasure in doing things?:no   Gynecologic History Patient's last menstrual period was 09/09/2020. Contraception: OCP (estrogen/progesterone) Last Pap: 05-03-2020. Results were: normal Last mammogram: n.a. Results were: n/a  Obstetric History OB History  Gravida Para Term Preterm AB Living  1 1 1     1   SAB IAB Ectopic Multiple Live Births        0 1    # Outcome Date GA Lbr Len/2nd Weight Sex Delivery Anes PTL Lv  1 Term 02/07/19 [id number] / 01:35 7 lb 2.5 oz (3.246 kg) F Vag-Spont EPI  LIV     Birth Comments: WDLs    Past Medical History:  Diagnosis Date   Chest pain 03/06/2013   Saw [facility] for CP on exertion in cold weather; cleared for sports, no restrictions   Chest pain on exertion 03/06/2013   Cholecystitis    Colitis    Proctitis    Proctitis     Past Surgical History:  Procedure Laterality Date   EYE SURGERY Bilateral    age 20  TONSILECTOMY/ADENOIDECTOMY WITH MYRINGOTOMY       Current Outpatient Medications:    metroNIDAZOLE (METROGEL  VAGINAL) 0.75 % vaginal gel, Place 1 Applicatorful vaginally 2 (two) times daily., Disp: 70 g, Rfl: 5   dicyclomine (BENTYL) 10 MG capsule, Take 1 capsule (10 mg total) by mouth 4 (four) times daily -  before meals and at bedtime. (Patient not taking: Reported on 09/13/2020), Disp: 120 capsule, Rfl: 2   norgestimate-ethinyl estradiol (ORTHO-CYCLEN) 0.25-35 MG-MCG tablet, Take 1 tablet by mouth daily., Disp: 28 tablet, Rfl: 11   ondansetron (ZOFRAN) 4 MG tablet, Take 1 tablet (4 mg total) by mouth every 6 (six) hours as needed for nausea or vomiting. (Patient not taking: Reported on 09/13/2020), Disp: 40 tablet, Rfl: 1   pantoprazole (PROTONIX) 40 MG tablet, Take 1 tablet (40 mg total) by mouth daily. (Patient not taking: Reported on 09/13/2020), Disp: 30 tablet, Rfl: 2 Allergies  Allergen Reactions   Lactose Intolerance (Gi) Other (See Comments)    Upset stomach   Sulfa Antibiotics Itching and Swelling    Burning    Ibuprofen     Hemorrhage per pt     Social History   Tobacco Use   Smoking status: Never   Smokeless tobacco: Never  Substance Use Topics   Alcohol use: No    Alcohol/week: 0.0 standard drinks    Family History  Problem Relation Age of Onset   Hypertension Mother    Healthy Father    Other Maternal Grandmother  stomach problems   Irritable bowel syndrome Maternal Grandmother    Diabetes Other    Diabetes Paternal Aunt    Vision loss Paternal Grandmother    Diabetes Paternal Grandmother    Heart disease Maternal Grandfather    Heart disease Maternal Uncle    Inflammatory bowel disease Neg Hx       Review of Systems  Constitutional: negative for fatigue and weight loss Respiratory: negative for cough and wheezing Cardiovascular: negative for chest pain, fatigue and palpitations Gastrointestinal: negative for abdominal pain and change in bowel habits Musculoskeletal:negative for myalgias Neurological: negative for gait problems and  tremors Behavioral/Psych: negative for abusive relationship, depression Endocrine: negative for temperature intolerance    Genitourinary:negative for abnormal menstrual periods, genital lesions, hot flashes, sexual problems and vaginal discharge Integument/breast: negative for breast lump, breast tenderness, nipple discharge and skin lesion(s)    Objective:       LMP 09/09/2020  General:   Alert and no distress  Skin:   no rash or abnormalities  Lungs:   clear to auscultation bilaterally  Heart:   regular rate and rhythm, S1, S2 normal, no murmur, click, rub or gallop  Breasts:   normal without suspicious masses, skin or nipple changes or axillary nodes  Abdomen:  normal findings: no organomegaly, soft, non-tender and no hernia  Pelvis:  External genitalia: normal general appearance Urinary system: urethral meatus normal and bladder without fullness, nontender Vaginal: normal without tenderness, induration or masses Cervix: normal appearance Adnexa: normal bimanual exam Uterus: anteverted and non-tender, normal size   Lab Review Urine pregnancy test Labs reviewed yes Radiologic studies reviewed no  I have spent a total of 10 minutes of non-face-to-face time, excluding clinical staff time, reviewing notes and preparing to see patient, ordering tests and/or medications, and counseling the patient.   Assessment:    1. Abnormal uterine bleeding (AUB) - continue OCP's  2. Dysmenorrhea - Tylenol prn  3. BV (bacterial vaginosis) Rx: - metroNIDAZOLE (METROGEL VAGINAL) 0.75 % vaginal gel; Place 1 Applicatorful vaginally 2 (two) times daily.  Dispense: 70 g; Refill: 5     Plan:    Education reviewed: calcium supplements, depression evaluation, low fat, low cholesterol diet, safe sex/STD prevention, self breast exams, and weight bearing exercise. Contraception: OCP (estrogen/progesterone). Follow up in: 7 months.   Meds ordered this encounter  Medications   metroNIDAZOLE  (METROGEL VAGINAL) 0.75 % vaginal gel    Sig: Place 1 Applicatorful vaginally 2 (two) times daily.    Dispense:  70 g    Refill:  5     Shelly Bombard, MD 09/27/2020 8:26 AM

## 2020-10-02 ENCOUNTER — Ambulatory Visit: Payer: Medicaid Other

## 2020-10-05 DIAGNOSIS — N898 Other specified noninflammatory disorders of vagina: Secondary | ICD-10-CM | POA: Diagnosis not present

## 2020-10-05 DIAGNOSIS — Z202 Contact with and (suspected) exposure to infections with a predominantly sexual mode of transmission: Secondary | ICD-10-CM | POA: Diagnosis not present

## 2021-01-20 ENCOUNTER — Other Ambulatory Visit: Payer: Self-pay

## 2021-01-20 ENCOUNTER — Other Ambulatory Visit (HOSPITAL_COMMUNITY)
Admission: RE | Admit: 2021-01-20 | Discharge: 2021-01-20 | Disposition: A | Discharge status: Home / Self Care | Payer: Medicaid Other | Source: Ambulatory Visit | Attending: Obstetrics and Gynecology | Admitting: Obstetrics and Gynecology

## 2021-01-20 ENCOUNTER — Ambulatory Visit (INDEPENDENT_AMBULATORY_CARE_PROVIDER_SITE_OTHER): Payer: Medicaid Other

## 2021-01-20 DIAGNOSIS — B851 Pediculosis due to Pediculus humanus corporis: Secondary | ICD-10-CM | POA: Diagnosis not present

## 2021-01-20 DIAGNOSIS — N898 Other specified noninflammatory disorders of vagina: Secondary | ICD-10-CM

## 2021-01-20 NOTE — Progress Notes (Signed)
Pt is in the office reporting vaginal discharge, desires std testing today. Pt will perform self-swab for testing

## 2021-01-20 NOTE — Progress Notes (Signed)
Patient was assessed and managed by nursing staff during this encounter. I have reviewed the chart and agree with the documentation and plan. I have also made any necessary editorial changes.  Mora Bellman, MD 01/20/2021 4:37 PM

## 2021-01-21 LAB — CERVICOVAGINAL ANCILLARY ONLY
Bacterial Vaginitis (gardnerella): NEGATIVE
Candida Glabrata: NEGATIVE
Candida Vaginitis: NEGATIVE
Chlamydia: NEGATIVE
Comment: NEGATIVE
Comment: NEGATIVE
Comment: NEGATIVE
Comment: NEGATIVE
Comment: NEGATIVE
Comment: NORMAL
Neisseria Gonorrhea: NEGATIVE
Trichomonas: NEGATIVE

## 2021-03-05 ENCOUNTER — Other Ambulatory Visit: Payer: Self-pay

## 2021-03-05 ENCOUNTER — Ambulatory Visit (INDEPENDENT_AMBULATORY_CARE_PROVIDER_SITE_OTHER): Payer: Medicaid Other | Admitting: *Deleted

## 2021-03-05 ENCOUNTER — Other Ambulatory Visit (HOSPITAL_COMMUNITY)
Admission: RE | Admit: 2021-03-05 | Discharge: 2021-03-05 | Disposition: A | Discharge status: Home / Self Care | Payer: Medicaid Other | Source: Ambulatory Visit | Attending: Obstetrics and Gynecology | Admitting: Obstetrics and Gynecology

## 2021-03-05 DIAGNOSIS — Z113 Encounter for screening for infections with a predominantly sexual mode of transmission: Secondary | ICD-10-CM | POA: Diagnosis present

## 2021-03-05 DIAGNOSIS — N898 Other specified noninflammatory disorders of vagina: Secondary | ICD-10-CM | POA: Insufficient documentation

## 2021-03-05 NOTE — Progress Notes (Signed)
SUBJECTIVE:  20 y.o. female complains of vaginal discharge and std screening.  Denies abnormal vaginal bleeding or significant pelvic pain or fever. No UTI symptoms. Denies history of known exposure to STD.  No LMP recorded. (Menstrual status: Oral contraceptives).  OBJECTIVE:  She appears well, afebrile. Urine dipstick: not done.  ASSESSMENT:  Vaginal Discharge     PLAN:  Pt request full panel swab.  GC, chlamydia, trichomonas, BVAG, CVAG probe sent to lab. Treatment: To be determined once lab results are received ROV prn if symptoms persist or worsen.

## 2021-03-06 LAB — CERVICOVAGINAL ANCILLARY ONLY
Bacterial Vaginitis (gardnerella): NEGATIVE
Candida Glabrata: NEGATIVE
Candida Vaginitis: NEGATIVE
Chlamydia: NEGATIVE
Comment: NEGATIVE
Comment: NEGATIVE
Comment: NEGATIVE
Comment: NEGATIVE
Comment: NEGATIVE
Comment: NORMAL
Neisseria Gonorrhea: POSITIVE — AB
Trichomonas: NEGATIVE

## 2021-03-07 ENCOUNTER — Ambulatory Visit (INDEPENDENT_AMBULATORY_CARE_PROVIDER_SITE_OTHER): Payer: Medicaid Other

## 2021-03-07 ENCOUNTER — Other Ambulatory Visit: Payer: Self-pay

## 2021-03-07 ENCOUNTER — Telehealth: Payer: Self-pay

## 2021-03-07 VITALS — BP 130/74 | HR 84

## 2021-03-07 DIAGNOSIS — A5402 Gonococcal vulvovaginitis, unspecified: Secondary | ICD-10-CM | POA: Diagnosis not present

## 2021-03-07 DIAGNOSIS — Z113 Encounter for screening for infections with a predominantly sexual mode of transmission: Secondary | ICD-10-CM

## 2021-03-07 MED ORDER — CEFTRIAXONE SODIUM 500 MG IJ SOLR
500.0000 mg | Freq: Once | INTRAMUSCULAR | Status: AC
  Administered 2021-03-07: 500 mg via INTRAMUSCULAR

## 2021-03-07 NOTE — Telephone Encounter (Signed)
Pt called requesting to be scheduled for GC injection. Pt scheduled for today.

## 2021-03-07 NOTE — Telephone Encounter (Signed)
-----   Message from Radene Gunning, MD sent at 03/07/2021  8:22 AM EST ----- Pt has GC - please bring in for rocephin per protocol. Thanks, pad

## 2021-03-07 NOTE — Progress Notes (Signed)
Robin Garrison 10/08/2020 presents to the clinic for STI treatment. Patient tested positive for GC and will need treatment.  Rocephin- 500 mg IM given per protocol. Patient advised to make partner or partners aware and [facility]  Side Effects: None  Follow up: 4 weeks for TOC

## 2021-03-31 ENCOUNTER — Other Ambulatory Visit (HOSPITAL_COMMUNITY)
Admission: RE | Admit: 2021-03-31 | Discharge: 2021-03-31 | Disposition: A | Discharge status: Home / Self Care | Payer: Medicaid Other | Source: Ambulatory Visit | Attending: Obstetrics and Gynecology | Admitting: Obstetrics and Gynecology

## 2021-03-31 ENCOUNTER — Other Ambulatory Visit: Payer: Self-pay

## 2021-03-31 ENCOUNTER — Ambulatory Visit (INDEPENDENT_AMBULATORY_CARE_PROVIDER_SITE_OTHER): Payer: Medicaid Other | Admitting: *Deleted

## 2021-03-31 VITALS — Wt 152.0 lb

## 2021-03-31 DIAGNOSIS — Z8619 Personal history of other infectious and parasitic diseases: Secondary | ICD-10-CM

## 2021-03-31 DIAGNOSIS — N898 Other specified noninflammatory disorders of vagina: Secondary | ICD-10-CM

## 2021-03-31 NOTE — Progress Notes (Signed)
Patient was assessed and managed by nursing staff during this encounter. I have reviewed the chart and agree with the documentation and plan. I have also made any necessary editorial changes.  Mora Bellman, MD 03/31/2021 1:59 PM

## 2021-03-31 NOTE — Progress Notes (Signed)
SUBJECTIVE:  20 y.o. female presents for TOC for recent +GC. Denies abnormal vaginal bleeding or significant pelvic pain or fever. No UTI symptoms. Pt states some labial itch/irritation.  Denies history of known exposure to STD.  No LMP recorded. (Menstrual status: Oral contraceptives).  OBJECTIVE:  She appears well, afebrile. Urine dipstick: not done.  ASSESSMENT:  Hx GC infection Labial irritation Self swab   PLAN:  GC, chlamydia, trichomonas, BVAG, CVAG probe sent to lab per pt request.  Treatment: To be determined once lab results are received ROV prn if symptoms persist or worsen.

## 2021-04-03 LAB — CERVICOVAGINAL ANCILLARY ONLY
Bacterial Vaginitis (gardnerella): NEGATIVE
Candida Glabrata: NEGATIVE
Candida Vaginitis: POSITIVE — AB
Chlamydia: NEGATIVE
Comment: NEGATIVE
Comment: NEGATIVE
Comment: NEGATIVE
Comment: NEGATIVE
Comment: NEGATIVE
Comment: NORMAL
Neisseria Gonorrhea: NEGATIVE
Trichomonas: NEGATIVE

## 2021-04-04 MED ORDER — FLUCONAZOLE 150 MG PO TABS: 150.0000 mg | ORAL_TABLET | Freq: Once | ORAL | 0 refills | Status: AC

## 2021-04-04 NOTE — Addendum Note (Signed)
Addended by: Mora Bellman on: 04/04/2021 09:15 AM   Modules accepted: Orders

## 2021-04-21 ENCOUNTER — Emergency Department (HOSPITAL_COMMUNITY)
Admission: EM | Admit: 2021-04-21 | Discharge: 2021-04-21 | Disposition: A | Discharge status: Home / Self Care | Payer: Medicaid Other | Attending: Student | Admitting: Student

## 2021-04-21 ENCOUNTER — Encounter (HOSPITAL_COMMUNITY): Payer: Self-pay | Admitting: Emergency Medicine

## 2021-04-21 DIAGNOSIS — N939 Abnormal uterine and vaginal bleeding, unspecified: Secondary | ICD-10-CM | POA: Diagnosis not present

## 2021-04-21 DIAGNOSIS — Z5321 Procedure and treatment not carried out due to patient leaving prior to being seen by health care provider: Secondary | ICD-10-CM | POA: Diagnosis not present

## 2021-04-21 DIAGNOSIS — R109 Unspecified abdominal pain: Secondary | ICD-10-CM | POA: Diagnosis not present

## 2021-04-21 LAB — COMPREHENSIVE METABOLIC PANEL
ALT: 15 U/L (ref 0–44)
AST: 20 U/L (ref 15–41)
Albumin: 4.2 g/dL (ref 3.5–5.0)
Alkaline Phosphatase: 35 U/L — ABNORMAL LOW (ref 38–126)
Anion gap: 6 (ref 5–15)
BUN: 12 mg/dL (ref 6–20)
CO2: 26 mmol/L (ref 22–32)
Calcium: 9 mg/dL (ref 8.9–10.3)
Chloride: 105 mmol/L (ref 98–111)
Creatinine, Ser: 0.75 mg/dL (ref 0.44–1.00)
GFR, Estimated: >60 mL/min (ref >60)
Glucose, Bld: 82 mg/dL (ref 70–99)
Potassium: 3.9 mmol/L (ref 3.5–5.1)
Sodium: 137 mmol/L (ref 135–145)
Total Bilirubin: 0.4 mg/dL (ref 0.3–1.2)
Total Protein: 7.6 g/dL (ref 6.5–8.1)

## 2021-04-21 LAB — CBC WITH DIFFERENTIAL/PLATELET
Abs Immature Granulocytes: 0 10*3/uL (ref 0.00–0.07)
Basophils Absolute: 0 10*3/uL (ref 0.0–0.1)
Basophils Relative: 1 %
Eosinophils Absolute: 0.1 10*3/uL (ref 0.0–0.5)
Eosinophils Relative: 2 %
HCT: 39.9 % (ref 36.0–46.0)
Hemoglobin: 12.9 g/dL (ref 12.0–15.0)
Immature Granulocytes: 0 %
Lymphocytes Relative: 47 %
Lymphs Abs: 2 10*3/uL (ref 0.7–4.0)
MCH: 30.1 pg (ref 26.0–34.0)
MCHC: 32.3 g/dL (ref 30.0–36.0)
MCV: 93.2 fL (ref 80.0–100.0)
Monocytes Absolute: 0.2 10*3/uL (ref 0.1–1.0)
Monocytes Relative: 5 %
Neutro Abs: 1.9 10*3/uL (ref 1.7–7.7)
Neutrophils Relative %: 45 %
Platelets: 358 10*3/uL (ref 150–400)
RBC: 4.28 MIL/uL (ref 3.87–5.11)
RDW: 13.3 % (ref 11.5–15.5)
WBC: 4.3 10*3/uL (ref 4.0–10.5)
nRBC: 0 % (ref 0.0–0.2)

## 2021-04-21 LAB — I-STAT BETA HCG BLOOD, ED (MC, WL, AP ONLY): I-stat hCG, quantitative: <5 m[IU]/mL (ref <5)

## 2021-04-21 LAB — TYPE AND SCREEN
ABO/RH(D): B POS
Antibody Screen: NEGATIVE

## 2021-04-21 LAB — LIPASE, BLOOD: Lipase: 47 U/L (ref 11–51)

## 2021-04-21 NOTE — ED Triage Notes (Signed)
Patient reports vaginal bleeding with menstrual cycle  x2 weeks. States taking oral contraceptive as prescribed. Reports some abdominal cramping.

## 2021-04-21 NOTE — ED Provider Notes (Signed)
Emergency Medicine Provider Triage Evaluation Note  Robin Garrison , a 21 y.o. female  was evaluated in triage.  Pt complains of vaginal bleeding x2 weeks.  She endorses passing large clots a few days ago however, none since.  Denies history of heavy menstrual cycles.  Patient is currently on birth control.  Admits to lower abdominal cramping.  Denies chest pain, palpitations, shortness of breath, and lightheadedness.  Review of Systems  Positive: Vaginal bleeding Negative: lightheadedness  Physical Exam  BP 114/72 (BP Location: Left Arm)    Pulse 84    Temp 98.7 F (37.1 C) (Oral)    Resp 14    Ht 5\' 2"  (1.575 m)    Wt 68.9 kg    LMP 04/07/2021    SpO2 97%    BMI 27.80 kg/m  Gen:   Awake, no distress   Resp:  Normal effort  MSK:   Moves extremities without difficulty  Other:    Medical Decision Making  Medically screening exam initiated at 3:52 PM.  Appropriate orders placed.  04/09/2021 I Freedman was informed that the remainder of the evaluation will be completed by another provider, this initial triage assessment does not replace that evaluation, and the importance of remaining in the ED until their evaluation is complete.  Vaginal bleeding Labs to check hemoglobin Pregnancy test   Jean Rosenthal 04/21/21 [id number]    06/19/21, MD 04/21/21 [id number]

## 2021-05-07 ENCOUNTER — Ambulatory Visit: Payer: Medicaid Other | Admitting: Obstetrics

## 2021-05-28 ENCOUNTER — Other Ambulatory Visit (HOSPITAL_COMMUNITY)
Admission: RE | Admit: 2021-05-28 | Discharge: 2021-05-28 | Disposition: A | Discharge status: Home / Self Care | Payer: Medicaid Other | Source: Ambulatory Visit | Attending: Obstetrics and Gynecology | Admitting: Obstetrics and Gynecology

## 2021-05-28 ENCOUNTER — Ambulatory Visit: Payer: Medicaid Other

## 2021-05-28 ENCOUNTER — Ambulatory Visit (INDEPENDENT_AMBULATORY_CARE_PROVIDER_SITE_OTHER): Payer: Medicaid Other | Admitting: *Deleted

## 2021-05-28 ENCOUNTER — Other Ambulatory Visit: Payer: Self-pay

## 2021-05-28 VITALS — BP 128/76 | HR 88 | Ht 62.5 in | Wt 158.0 lb

## 2021-05-28 DIAGNOSIS — Z113 Encounter for screening for infections with a predominantly sexual mode of transmission: Secondary | ICD-10-CM | POA: Diagnosis not present

## 2021-05-28 NOTE — Progress Notes (Signed)
SUBJECTIVE:  21 y.o. female who desires a STI screen. Reports thin white vaginal discharge. Denies bleeding or significant pelvic pain. No UTI symptoms. Denies history of known exposure to STD.  LMP: 04/07/21, lasted 5 weeks (OCPs-denies missed doses)  OBJECTIVE:  She appears well.   ASSESSMENT:  STI Screen   PLAN:  Pt offered STI blood screening-declined GC, chlamydia, and trichomonas probe sent to lab.  Treatment: To be determined once lab results are received.  Pt follow up as needed. Advised follow up for breakthrough bleeding with OCPs as needed.

## 2021-05-29 ENCOUNTER — Encounter: Payer: Self-pay | Admitting: Obstetrics

## 2021-05-29 LAB — CERVICOVAGINAL ANCILLARY ONLY
Chlamydia: NEGATIVE
Comment: NEGATIVE
Comment: NEGATIVE
Comment: NORMAL
Neisseria Gonorrhea: NEGATIVE
Trichomonas: NEGATIVE

## 2021-05-30 ENCOUNTER — Telehealth: Payer: Self-pay

## 2021-05-30 NOTE — Telephone Encounter (Signed)
Returned call, pt wants to try depo due to bleeding with BC pills. Advised overdue for annual. Scheduler will call for appt, pt agreed.

## 2021-06-10 DIAGNOSIS — Z113 Encounter for screening for infections with a predominantly sexual mode of transmission: Secondary | ICD-10-CM | POA: Diagnosis not present

## 2021-06-16 ENCOUNTER — Ambulatory Visit (INDEPENDENT_AMBULATORY_CARE_PROVIDER_SITE_OTHER): Payer: Medicaid Other | Admitting: Obstetrics and Gynecology

## 2021-06-16 ENCOUNTER — Other Ambulatory Visit: Payer: Self-pay

## 2021-06-16 ENCOUNTER — Other Ambulatory Visit (HOSPITAL_COMMUNITY)
Admission: RE | Admit: 2021-06-16 | Discharge: 2021-06-16 | Disposition: A | Discharge status: Home / Self Care | Payer: Medicaid Other | Source: Ambulatory Visit | Attending: Obstetrics | Admitting: Obstetrics

## 2021-06-16 ENCOUNTER — Encounter: Payer: Self-pay | Admitting: Obstetrics and Gynecology

## 2021-06-16 VITALS — BP 111/76 | HR 81 | Ht 62.0 in | Wt 159.0 lb

## 2021-06-16 DIAGNOSIS — Z01419 Encounter for gynecological examination (general) (routine) without abnormal findings: Secondary | ICD-10-CM

## 2021-06-16 DIAGNOSIS — A749 Chlamydial infection, unspecified: Secondary | ICD-10-CM

## 2021-06-16 DIAGNOSIS — N921 Excessive and frequent menstruation with irregular cycle: Secondary | ICD-10-CM | POA: Diagnosis not present

## 2021-06-16 DIAGNOSIS — Z113 Encounter for screening for infections with a predominantly sexual mode of transmission: Secondary | ICD-10-CM | POA: Insufficient documentation

## 2021-06-16 DIAGNOSIS — N898 Other specified noninflammatory disorders of vagina: Secondary | ICD-10-CM | POA: Insufficient documentation

## 2021-06-16 MED ORDER — NORETHINDRONE-ETH ESTRADIOL 1-35 MG-MCG PO TABS: 1.0000 | ORAL_TABLET | Freq: Every day | ORAL | 3 refills | Status: DC

## 2021-06-16 NOTE — Progress Notes (Signed)
GYN presents for AEX.  Patient wants PAP and STD screening.  Cultures were done 05/28/21 NEG.  Last PAP 05/03/2020 Reports no concerns today.

## 2021-06-16 NOTE — Progress Notes (Signed)
Obstetrics and Gynecology Annual Patient Evaluation  Appointment Date: 06/16/2021  OBGYN Clinic: [facility]  Chief Complaint:  Chief Complaint  Patient presents with   Gynecologic Exam    History of Present Illness: Robin Garrison is a 21 y.o. African-American G1P1001 (No LMP recorded. (Menstrual status: Oral contraceptives).), seen for the above chief complaint. Her past medical history is significant for UC,  h/o STIs.  Patient would with AUB on Orthocyclen with periods lasting about 1-2 weeks. She is interested in another [facility] method.   Review of Systems: Pertinent items noted in HPI and remainder of comprehensive ROS otherwise negative.    Past Medical History:  Past Medical History:  Diagnosis Date   Chest pain 03/06/2013   Saw [facility] cardiolgy for CP on exertion in cold weather; cleared for sports, no restrictions   Cholecystitis    Colitis    History of gonorrhea    Proctitis    Ulcerative colitis ([place])     Past Surgical History:  Past Surgical History:  Procedure Laterality Date   EYE SURGERY Bilateral    age 5   TONSILECTOMY/ADENOIDECTOMY WITH MYRINGOTOMY      Past Obstetrical History:  OB History  Gravida Para Term Preterm AB Living  1 1 1     1   SAB IAB Ectopic Multiple Live Births        0 1    # Outcome Date GA Lbr Len/2nd Weight Sex Delivery Anes PTL Lv  1 Term 02/07/19 [id number]:25 / 01:35 7 lb 2.5 oz (3.246 kg) F Vag-Spont EPI  LIV     Birth Comments: WDLs    Social History:  Social History   Socioeconomic History   Marital status: Single    Spouse name: Not on file   Number of children: 1   Years of education: Not on file   Highest education level: Not on file  Occupational History   Occupation: [employer]  Tobacco Use   Smoking status: Never   Smokeless tobacco: Never  Vaping Use   Vaping Use: Never used  Substance and Sexual Activity   Alcohol use: No    Alcohol/week: 0.0 standard drinks   Drug use: No   Sexual activity: Yes     Partners: Male    Birth control/protection: Pill  Other Topics Concern   Not on file  Social History Narrative   8th grade   Social Determinants of Health   Financial Resource Strain: Not on file  Food Insecurity: Not on file  Transportation Needs: Not on file  Physical Activity: Not on file  Stress: Not on file  Social Connections: Not on file  Intimate Partner Violence: Not on file    Family History:  Family History  Problem Relation Age of Onset   Hypertension Mother    Healthy Father    Other Maternal Grandmother        stomach problems   Irritable bowel syndrome Maternal Grandmother    Diabetes Other    Diabetes Paternal Aunt    Vision loss Paternal Grandmother    Diabetes Paternal Grandmother    Heart disease Maternal Grandfather    Heart disease Maternal Uncle    Inflammatory bowel disease Neg Hx     Medications EMacaoI.Glennon Machad no medications administered during this visit. Current Outpatient Medications  Medication Sig Dispense Refill   Ortho Cyclen Take 1 tablet by mouth daily.     dicyclomine (BENTYL) 10 MG capsule Take 1 capsule (10 mg total) by  mouth 4 (four) times daily -  before meals and at bedtime. (Patient not taking: No sig reported) 120 capsule 2   ondansetron (ZOFRAN) 4 MG tablet Take 1 tablet (4 mg total) by mouth every 6 (six) hours as needed for nausea or vomiting. (Patient not taking: No sig reported) 40 tablet 1   pantoprazole (PROTONIX) 40 MG tablet Take 1 tablet (40 mg total) by mouth daily. (Patient not taking: No sig reported) 30 tablet 2   No current facility-administered medications for this visit.    Allergies Lactose intolerance (gi), Sulfa antibiotics, and Ibuprofen   Physical Exam:  BP 111/76    Pulse 81    Ht 5' 2"  (1.575 m)    Wt 159 lb (72.1 kg)    BMI 29.08 kg/m  Body mass index is 29.08 kg/m. General appearance: Well nourished, well developed female in no acute distress.  Neck:  Supple, normal appearance, and no  thyromegaly  Cardiovascular: normal s1 and s2.  No murmurs, rubs or gallops. Respiratory:  Clear to auscultation bilateral. Normal respiratory effort Abdomen: positive bowel sounds and no masses, hernias; diffusely non tender to palpation, non distended Breasts: patient denies any breast s/s. Neuro/Psych:  Normal mood and affect.  Skin:  Warm and dry.  Lymphatic:  No inguinal lymphadenopathy.   Pelvic exam: is not limited by body habitus EGBUS: within normal limits Vagina: within normal limits and with no blood or discharge in the vault Cervix: normal appearing cervix without tenderness, discharge or lesions. Uterus:  nonenlarged and non tender Adnexa:  normal adnexa and no mass, fullness, tenderness Rectovaginal: deferred  Laboratory: none  Radiology: none  Assessment: pt stable  Plan: 1. Well woman exam with routine gynecological exam - HepB+HepC+HIV Panel - RPR - Cytology - PAP( [place]) - Cervicovaginal ancillary only( [place])  2. Screen for STD (sexually transmitted disease) Pt desires rpt screening today - HepB+HepC+HIV Panel - RPR - Cytology - PAP( [place]) - Cervicovaginal ancillary only( [place])  3. Vaginal discharge - Cervicovaginal ancillary only( [place])  4. Breakthrough bleeding on OCPs Pt seen for this last year and u/s ordered but not done; no obvious abnormalities on 07/2020 CT A/P. I told her to let's try a different type of OCP and if AUB persists then will need an u/s which she is amenable to. Will finish out current pill for this month and then switch to Ortho Novum  RTC 46m  CDurene RomansMD Attending Center for [employer]([facility]

## 2021-06-17 ENCOUNTER — Other Ambulatory Visit: Payer: Self-pay | Admitting: *Deleted

## 2021-06-17 ENCOUNTER — Telehealth: Payer: Self-pay | Admitting: *Deleted

## 2021-06-17 ENCOUNTER — Encounter: Payer: Self-pay | Admitting: Obstetrics

## 2021-06-17 DIAGNOSIS — A749 Chlamydial infection, unspecified: Secondary | ICD-10-CM

## 2021-06-17 LAB — CERVICOVAGINAL ANCILLARY ONLY
Bacterial Vaginitis (gardnerella): NEGATIVE
Candida Glabrata: NEGATIVE
Candida Vaginitis: NEGATIVE
Chlamydia: POSITIVE — AB
Comment: NEGATIVE
Comment: NEGATIVE
Comment: NEGATIVE
Comment: NEGATIVE
Comment: NEGATIVE
Comment: NORMAL
Neisseria Gonorrhea: NEGATIVE
Trichomonas: NEGATIVE

## 2021-06-17 LAB — HEPB+HEPC+HIV PANEL
HIV Screen 4th Generation wRfx: NONREACTIVE
Hep B C IgM: NEGATIVE
Hep B Core Total Ab: NEGATIVE
Hep B E Ab: NEGATIVE
Hep B E Ag: NEGATIVE
Hep B Surface Ab, Qual: NONREACTIVE
Hep C Virus Ab: NONREACTIVE
Hepatitis B Surface Ag: NEGATIVE

## 2021-06-17 LAB — RPR: RPR Ser Ql: NONREACTIVE

## 2021-06-17 LAB — TSH: TSH: 1.24 u[IU]/mL (ref 0.450–4.500)

## 2021-06-17 LAB — CBC
Hematocrit: 38 % (ref 34.0–46.6)
Hemoglobin: 12.8 g/dL (ref 11.1–15.9)
MCH: 30.6 pg (ref 26.6–33.0)
MCHC: 33.7 g/dL (ref 31.5–35.7)
MCV: 91 fL (ref 79–97)
Platelets: 351 10*3/uL (ref 150–450)
RBC: 4.18 x10E6/uL (ref 3.77–5.28)
RDW: 12.2 % (ref 11.7–15.4)
WBC: 5.5 10*3/uL (ref 3.4–10.8)

## 2021-06-17 LAB — CYTOLOGY - PAP: Diagnosis: NEGATIVE

## 2021-06-17 LAB — BETA HCG QUANT (REF LAB): hCG Quant: <1 m[IU]/mL

## 2021-06-17 MED ORDER — DOXYCYCLINE HYCLATE 100 MG PO TABS: 100.0000 mg | ORAL_TABLET | Freq: Two times a day (BID) | ORAL | 0 refills | Status: DC

## 2021-06-17 NOTE — Progress Notes (Signed)
Doxycycline sent to pharmacy for +CH. Pt made aware of STD recommendations.

## 2021-06-17 NOTE — Telephone Encounter (Signed)
Pt called to office- reviewed current resulted labs.  Pt also states she is interested in Depo to help regulate cycles as discussed at appt.  Pt states she is currently taking pills.    Advised message will have to be sent to provider for review and approval.   Please advise to [place] clinical pool.

## 2021-06-18 DIAGNOSIS — A749 Chlamydial infection, unspecified: Secondary | ICD-10-CM | POA: Insufficient documentation

## 2021-06-27 ENCOUNTER — Encounter: Payer: Self-pay | Admitting: *Deleted

## 2021-07-05 DIAGNOSIS — Z113 Encounter for screening for infections with a predominantly sexual mode of transmission: Secondary | ICD-10-CM | POA: Diagnosis not present

## 2021-07-05 DIAGNOSIS — N760 Acute vaginitis: Secondary | ICD-10-CM | POA: Diagnosis not present

## 2021-07-08 ENCOUNTER — Ambulatory Visit: Payer: Medicaid Other | Admitting: Obstetrics

## 2021-07-17 ENCOUNTER — Other Ambulatory Visit: Payer: Self-pay

## 2021-07-17 DIAGNOSIS — N939 Abnormal uterine and vaginal bleeding, unspecified: Secondary | ICD-10-CM

## 2021-07-17 MED ORDER — MEDROXYPROGESTERONE ACETATE 150 MG/ML IM SUSP: 150.0000 mg | INTRAMUSCULAR | 0 refills | Status: DC

## 2021-07-21 ENCOUNTER — Ambulatory Visit (INDEPENDENT_AMBULATORY_CARE_PROVIDER_SITE_OTHER): Payer: Medicaid Other | Admitting: *Deleted

## 2021-07-21 ENCOUNTER — Other Ambulatory Visit (HOSPITAL_COMMUNITY)
Admission: RE | Admit: 2021-07-21 | Discharge: 2021-07-21 | Disposition: A | Discharge status: Home / Self Care | Payer: Medicaid Other | Source: Ambulatory Visit | Attending: Obstetrics and Gynecology | Admitting: Obstetrics and Gynecology

## 2021-07-21 DIAGNOSIS — Z3042 Encounter for surveillance of injectable contraceptive: Secondary | ICD-10-CM | POA: Diagnosis not present

## 2021-07-21 DIAGNOSIS — Z113 Encounter for screening for infections with a predominantly sexual mode of transmission: Secondary | ICD-10-CM | POA: Diagnosis not present

## 2021-07-21 LAB — POCT URINE PREGNANCY: Preg Test, Ur: NEGATIVE

## 2021-07-21 MED ORDER — MEDROXYPROGESTERONE ACETATE 150 MG/ML IM SUSP
150.0000 mg | Freq: Once | INTRAMUSCULAR | Status: AC
  Administered 2021-07-21: 150 mg via INTRAMUSCULAR

## 2021-07-21 MED ORDER — MEDROXYPROGESTERONE ACETATE 150 MG/ML IM SUSP: 150.0000 mg | INTRAMUSCULAR | 2 refills | Status: DC

## 2021-07-21 NOTE — Progress Notes (Signed)
Date last pap: 06/16/21. ?Last Depo-Provera: NA. ?Side Effects if any: NA. ?Serum HCG indicated? Negative. ?Depo-Provera 150 mg IM given by: Lillia Abed. Isla Sabree, RNC in right deltoid. ?Next appointment due 10/06/21-10/20/21.  ? ?Per Dr. Ilda Basset conversation with S. Foster, CMA on 06/23/21, patient can change from OCPs to Depo and patient should be advised to continue on OCPs for one week after getting Depo. Depo given. RX for 3 refills sent. ?

## 2021-07-21 NOTE — Progress Notes (Signed)
SUBJECTIVE:  ?21 y.o. female who desires a STI screen. ?Denies abnormal vaginal discharge, bleeding or significant pelvic pain. No UTI symptoms. Denies history of known exposure to STD. ? ?No LMP recorded. (Menstrual status: Oral contraceptives). ? ?OBJECTIVE:  ?She appears well. ? ? ?ASSESSMENT:  ?STI Screen  ? ?PLAN:  ?Pt offered STI blood screening-requested ?GC, chlamydia, and trichomonas probe sent to lab.  ?Treatment: To be determined once lab results are received. ? ?Pt follow up as needed.  ?

## 2021-07-22 LAB — HEPATITIS C ANTIBODY: Hep C Virus Ab: NONREACTIVE

## 2021-07-22 LAB — HIV ANTIBODY (ROUTINE TESTING W REFLEX): HIV Screen 4th Generation wRfx: NONREACTIVE

## 2021-07-22 LAB — HEPATITIS B SURFACE ANTIGEN: Hepatitis B Surface Ag: NEGATIVE

## 2021-07-22 LAB — RPR: RPR Ser Ql: NONREACTIVE

## 2021-07-23 LAB — CERVICOVAGINAL ANCILLARY ONLY
Chlamydia: NEGATIVE
Comment: NEGATIVE
Comment: NEGATIVE
Comment: NORMAL
Neisseria Gonorrhea: NEGATIVE
Trichomonas: NEGATIVE

## 2021-08-28 DIAGNOSIS — L309 Dermatitis, unspecified: Secondary | ICD-10-CM | POA: Diagnosis not present

## 2021-09-06 DIAGNOSIS — Z113 Encounter for screening for infections with a predominantly sexual mode of transmission: Secondary | ICD-10-CM | POA: Diagnosis not present

## 2021-09-06 DIAGNOSIS — N760 Acute vaginitis: Secondary | ICD-10-CM | POA: Diagnosis not present

## 2021-09-06 DIAGNOSIS — B9689 Other specified bacterial agents as the cause of diseases classified elsewhere: Secondary | ICD-10-CM | POA: Diagnosis not present

## 2021-09-06 DIAGNOSIS — R21 Rash and other nonspecific skin eruption: Secondary | ICD-10-CM | POA: Diagnosis not present

## 2021-09-16 ENCOUNTER — Ambulatory Visit: Payer: Medicaid Other | Admitting: Obstetrics & Gynecology

## 2021-10-14 ENCOUNTER — Ambulatory Visit: Payer: Medicaid Other

## 2021-10-24 ENCOUNTER — Telehealth: Payer: Self-pay

## 2021-10-24 ENCOUNTER — Other Ambulatory Visit: Payer: Self-pay | Admitting: Obstetrics & Gynecology

## 2021-10-24 DIAGNOSIS — A749 Chlamydial infection, unspecified: Secondary | ICD-10-CM

## 2021-10-24 NOTE — Telephone Encounter (Signed)
  Spoke with Pt; she does not want refills for the Vibramyocin, she wants to switch back to [facility] OCP's.  Advised Pt to make an appt with Provider to switch [facility].  Pt verbalized understanding and agreement

## 2021-10-24 NOTE — Telephone Encounter (Signed)
Spoke with Pt; she does not want refills for the Vibramyocin, she wants to switch back to [facility] OCP's.  Advised Pt to make an appt with Provider to switch [facility].  Pt verbalized understanding and agreement

## 2021-12-19 ENCOUNTER — Other Ambulatory Visit (HOSPITAL_COMMUNITY)
Admission: RE | Admit: 2021-12-19 | Discharge: 2021-12-19 | Disposition: A | Discharge status: Home / Self Care | Payer: Medicaid Other | Source: Ambulatory Visit | Attending: Obstetrics and Gynecology | Admitting: Obstetrics and Gynecology

## 2021-12-19 ENCOUNTER — Ambulatory Visit: Payer: Medicaid Other | Admitting: Obstetrics and Gynecology

## 2021-12-19 ENCOUNTER — Encounter: Payer: Self-pay | Admitting: Obstetrics and Gynecology

## 2021-12-19 VITALS — BP 119/80 | HR 76 | Wt 184.0 lb

## 2021-12-19 DIAGNOSIS — Z23 Encounter for immunization: Secondary | ICD-10-CM

## 2021-12-19 DIAGNOSIS — Z3041 Encounter for surveillance of contraceptive pills: Secondary | ICD-10-CM | POA: Diagnosis not present

## 2021-12-19 DIAGNOSIS — Z113 Encounter for screening for infections with a predominantly sexual mode of transmission: Secondary | ICD-10-CM | POA: Diagnosis not present

## 2021-12-19 MED ORDER — NORETHINDRONE-ETH ESTRADIOL 1-35 MG-MCG PO TABS: 1.0000 | ORAL_TABLET | Freq: Every day | ORAL | 4 refills | Status: DC

## 2021-12-19 NOTE — Progress Notes (Signed)
  CC: birth control change Subjective:    Patient ID: Robin Garrison, female    DOB: May 06, 2000, 21 y.o.   MRN: [id number]  HPI Pt seen for change of birth control method.   Pt was on depo provera, but did not like her weight gain.  She had some OCPs at home which she has started, and she wishes to continue.  Pt also desires STD testing.   Review of Systems     Objective:   Physical Exam Vitals:   12/19/21 0951  BP: 119/80  Pulse: 76         Assessment & Plan:   1. Encounter for birth control pills maintenance Convert to OCP, pt already doing with with current medication - norethindrone-ethinyl estradiol 1/35 (ORTHO-NOVUM) tablet; Take 1 tablet by mouth daily.  Dispense: 90 tablet; Refill: 4  2. Routine screening for STI (sexually transmitted infection) Per pt request - Cervicovaginal ancillary only( [place])  3. Needs flu shot  - Flu Vaccine QUAD 32moIM (Fluarix, Fluzone & Alfiuria Quad PF) Pap is current and WNL Annual exam 06/2022   LGriffin Basil MD Faculty Attending, Center [facility]

## 2021-12-19 NOTE — Progress Notes (Signed)
Pt presents for STD testing and BCP refill. Normal pap 05/2021

## 2021-12-23 LAB — CERVICOVAGINAL ANCILLARY ONLY
Bacterial Vaginitis (gardnerella): NEGATIVE
Candida Glabrata: NEGATIVE
Candida Vaginitis: NEGATIVE
Chlamydia: NEGATIVE
Comment: NEGATIVE
Comment: NEGATIVE
Comment: NEGATIVE
Comment: NEGATIVE
Comment: NEGATIVE
Comment: NORMAL
Neisseria Gonorrhea: NEGATIVE
Trichomonas: NEGATIVE

## 2022-01-23 ENCOUNTER — Other Ambulatory Visit: Payer: Self-pay

## 2022-01-23 ENCOUNTER — Telehealth: Payer: Self-pay | Admitting: Physician Assistant

## 2022-01-23 MED ORDER — DICYCLOMINE HCL 10 MG PO CAPS: 10.0000 mg | ORAL_CAPSULE | Freq: Three times a day (TID) | ORAL | 0 refills | Status: DC | PRN

## 2022-01-23 NOTE — Telephone Encounter (Signed)
Inbound call from patient requesting a call to discuss rx she is taking for colitis.

## 2022-01-23 NOTE — Telephone Encounter (Signed)
Patient advised of the plan of care. She agrees to this plan. Rx to her preferred pharmacy. No further questions.

## 2022-01-23 NOTE — Telephone Encounter (Signed)
Returned call. No answer. Left a message of my call on the voicemail.

## 2022-01-23 NOTE — Telephone Encounter (Signed)
Patient returning your call.

## 2022-01-23 NOTE — Telephone Encounter (Signed)
Please send Rx for Bentyl 74m q8h prn X 30 tabs with no refills, keep the follow up appt. Will need to come to ER if has persistent or worsening pain.

## 2022-01-23 NOTE — Telephone Encounter (Signed)
Spoke with patient. She has been seen once in [place] a year ago. She did not follow up as was planned. "A lot happened" and she was feeling okay as far as her bowel symptoms. She would take dicyclomine if she had any urgency or cramping.  Patient calls today with a request for a refill on dicyclomine. Reports she has had a gradual increase in the number of stools per day over the last "few weeks." She now has 10-15 soft to liquid stools with blood and mucous in most of the stools. She feel cramping and tenderness in her low abdomen. She cannot comfortably wear her seatbelt in the car. I have moved her appointment to the first opening on 02/18/22. She understands she must keep this appointment. Please advise.

## 2022-01-30 NOTE — Telephone Encounter (Signed)
Inbound call from patient stating that she believes that the Bentyl is not working. Patient is requesting a call back to discuss if she can get a higher dose or if there is a different medication she can take. Please advise.

## 2022-02-02 NOTE — Telephone Encounter (Signed)
Spoke with the patient. Her symptoms are persistent. Not worsening. She continued to have 10 to 15 bowel movements per day that have mucous are sometimes blood tinged as well. She is taking Bentyl for the abdominal cramping. She does not feel it is working as well as she thought it did. Monitoring the schedule for sooner appointment opening. Patient does not want to go to the ER.

## 2022-02-02 NOTE — Telephone Encounter (Signed)
Inbound call from patient. Please give a call back to further advise.  Thank you

## 2022-02-03 ENCOUNTER — Other Ambulatory Visit: Payer: Self-pay

## 2022-02-03 DIAGNOSIS — K625 Hemorrhage of anus and rectum: Secondary | ICD-10-CM

## 2022-02-03 DIAGNOSIS — R197 Diarrhea, unspecified: Secondary | ICD-10-CM

## 2022-02-03 NOTE — Telephone Encounter (Signed)
Patient is instructed. Agree to come for labs today.

## 2022-02-03 NOTE — Telephone Encounter (Signed)
Patient called wanting to follow up on previous message, also expressed concern with not getting a call back when requested.

## 2022-02-03 NOTE — Telephone Encounter (Signed)
Patient will come in to be evaluated on 02/05/22 at 8:30 am.  She is having a hard time working because she has to keep "running to the bathroom." Asks for medication to help alleviate this. She is taking dicyclomine. Suggestions?

## 2022-02-03 NOTE — Telephone Encounter (Signed)
Beth, can you please have her come in for GI path panel, C.diff to exclude acute GI infection. Check CBC and BMP. If no evidence of infection, can use Imodium (OTC) 1-2 tabs upto 3 times daily as needed for diarrhea

## 2022-02-05 ENCOUNTER — Encounter: Payer: Self-pay | Admitting: Gastroenterology

## 2022-02-05 ENCOUNTER — Other Ambulatory Visit (INDEPENDENT_AMBULATORY_CARE_PROVIDER_SITE_OTHER): Payer: Medicaid Other

## 2022-02-05 ENCOUNTER — Other Ambulatory Visit: Payer: Self-pay | Admitting: Gastroenterology

## 2022-02-05 ENCOUNTER — Ambulatory Visit (INDEPENDENT_AMBULATORY_CARE_PROVIDER_SITE_OTHER): Payer: Medicaid Other | Admitting: Gastroenterology

## 2022-02-05 ENCOUNTER — Other Ambulatory Visit (HOSPITAL_COMMUNITY): Payer: Self-pay

## 2022-02-05 VITALS — BP 118/70 | HR 84 | Ht 62.0 in | Wt 186.2 lb

## 2022-02-05 DIAGNOSIS — K51911 Ulcerative colitis, unspecified with rectal bleeding: Secondary | ICD-10-CM

## 2022-02-05 DIAGNOSIS — K625 Hemorrhage of anus and rectum: Secondary | ICD-10-CM | POA: Diagnosis not present

## 2022-02-05 DIAGNOSIS — K580 Irritable bowel syndrome with diarrhea: Secondary | ICD-10-CM

## 2022-02-05 DIAGNOSIS — R1031 Right lower quadrant pain: Secondary | ICD-10-CM | POA: Diagnosis not present

## 2022-02-05 DIAGNOSIS — R1032 Left lower quadrant pain: Secondary | ICD-10-CM

## 2022-02-05 LAB — CBC WITH DIFFERENTIAL/PLATELET
Basophils Absolute: 0 10*3/uL (ref 0.0–0.1)
Basophils Relative: 0.5 % (ref 0.0–3.0)
Eosinophils Absolute: 0.1 10*3/uL (ref 0.0–0.7)
Eosinophils Relative: 2.6 % (ref 0.0–5.0)
HCT: 38.1 % (ref 36.0–46.0)
Hemoglobin: 12.7 g/dL (ref 12.0–15.0)
Lymphocytes Relative: 28.8 % (ref 12.0–46.0)
Lymphs Abs: 1.4 10*3/uL (ref 0.7–4.0)
MCHC: 33.3 g/dL (ref 30.0–36.0)
MCV: 91.6 fl (ref 78.0–100.0)
Monocytes Absolute: 0.4 10*3/uL (ref 0.1–1.0)
Monocytes Relative: 7.3 % (ref 3.0–12.0)
Neutro Abs: 3 10*3/uL (ref 1.4–7.7)
Neutrophils Relative %: 60.8 % (ref 43.0–77.0)
Platelets: 347 10*3/uL (ref 150.0–400.0)
RBC: 4.16 Mil/uL (ref 3.87–5.11)
RDW: 12.7 % (ref 11.5–15.5)
WBC: 5 10*3/uL (ref 4.0–10.5)

## 2022-02-05 LAB — SEDIMENTATION RATE: Sed Rate: 17 mm/hr (ref 0–20)

## 2022-02-05 LAB — C-REACTIVE PROTEIN: CRP: 1.1 mg/dL (ref 0.5–20.0)

## 2022-02-05 LAB — COMPREHENSIVE METABOLIC PANEL
ALT: 12 U/L (ref 0–35)
AST: 15 U/L (ref 0–37)
Albumin: 4.2 g/dL (ref 3.5–5.2)
Alkaline Phosphatase: 41 U/L (ref 39–117)
BUN: 10 mg/dL (ref 6–23)
CO2: 28 mEq/L (ref 19–32)
Calcium: 9.5 mg/dL (ref 8.4–10.5)
Chloride: 105 mEq/L (ref 96–112)
Creatinine, Ser: 0.78 mg/dL (ref 0.40–1.20)
GFR: 107.13 mL/min (ref >60.00)
Glucose, Bld: 88 mg/dL (ref 70–99)
Potassium: 3.6 mEq/L (ref 3.5–5.1)
Sodium: 140 mEq/L (ref 135–145)
Total Bilirubin: 0.2 mg/dL (ref 0.2–1.2)
Total Protein: 7.2 g/dL (ref 6.0–8.3)

## 2022-02-05 LAB — IBC + FERRITIN
Ferritin: 20.6 ng/mL (ref 10.0–291.0)
Iron: 44 ug/dL (ref 42–145)
Saturation Ratios: 12.3 % — ABNORMAL LOW (ref 20.0–50.0)
TIBC: 358.4 ug/dL (ref 250.0–450.0)
Transferrin: 256 mg/dL (ref 212.0–360.0)

## 2022-02-05 LAB — B12 AND FOLATE PANEL
Folate: 15.2 ng/mL (ref >5.9)
Vitamin B-12: 320 pg/mL (ref 211–911)

## 2022-02-05 LAB — VITAMIN D 25 HYDROXY (VIT D DEFICIENCY, FRACTURES): VITD: 19.76 ng/mL — ABNORMAL LOW (ref 30.00–100.00)

## 2022-02-05 MED ORDER — MESALAMINE 1.2 G PO TBEC: 4.8000 g | DELAYED_RELEASE_TABLET | Freq: Every day | ORAL | 3 refills | Status: AC

## 2022-02-05 MED ORDER — HYOSCYAMINE SULFATE 0.125 MG SL SUBL: 0.1250 mg | SUBLINGUAL_TABLET | Freq: Three times a day (TID) | SUBLINGUAL | 0 refills | Status: DC | PRN

## 2022-02-05 NOTE — Patient Instructions (Addendum)
If you are age 23 or younger, your body mass index should be between 19-25. Your Body mass index is 34.07 kg/m. If this is out of the aformentioned range listed, please consider follow up with your Primary Care Provider.  ________________________________________________________  The [place] GI providers would like to encourage you to use [facility] to communicate with providers for non-urgent requests or questions.  Due to long hold times on the telephone, sending your provider a message by [facility] may be a faster and more efficient way to get a response.  Please allow 48 business hours for a response.  Please remember that this is for non-urgent requests.  _______________________________________________________  Your provider has requested that you go to the basement level for lab work before leaving today. Press "B" on the elevator. The lab is located at the first door on the left as you exit the elevator.  Due to recent changes in healthcare laws, you may see the results of your imaging and laboratory studies on MyChart before your provider has had a chance to review them.  We understand that in some cases there may be results that are confusing or concerning to you. Not all laboratory results come back in the same time frame and the provider may be waiting for multiple results in order to interpret others.  Please give [place] 48 hours in order for your provider to thoroughly review all the results before contacting the office for clarification of your results.   We have sent the following medications to your pharmacy for you to pick up at your convenience:  START: Mesalamine 1.2g take 4 daily  START: Hyoscyamine 0.125 mcg use three times daily as needed.   DISCONTINUE: Dicyclomine  You are scheduled to follow up on 03-20-22 at 830am with Tye Savoy, NP  Thank you for entrusting me with your care and choosing [facility].  Dr Silverio Decamp

## 2022-02-05 NOTE — Progress Notes (Signed)
Robin Garrison    [id number]    Apr 21, 2000  Primary Care Physician:Patient, No Pcp Per  Referring Physician: No referring provider defined for this encounter.   Chief complaint: Diarrhea  HPI:  21 year old female here for ulcerative colitis, has history of chronic noncompliance has no showed and canceled multiple visits in the past 2 years, was last seen in May 2021  Currently not on any medication for maintenance of ulcerative colitis.  Patient was advised to do blood work and stool studies when she called with complaints of diarrhea but she has not done either.  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   For past 2 months she is having 10-20 BM with blood in stool, both dark and bright red blood Constant nausea Abdominal pain, worse in the lower abdomen  She felt omega vitamins were helping, dicyclomine was also helping with abdominal cramping   Colonoscopy 09/13/19 - A tattoo was seen in the proximal transverse colon. - Inflammatory bowel disease. Inflammation was found from the anus to the hepatic flexure. This was moderate in severity. Biopsied. Colon biopsies consistent with active chronic colitis   Per patient she was diagnosed with ulcerative colitis around age 9.  No family history of GI malignancy.     Outpatient Encounter Medications as of 02/05/2022  Medication Sig   dicyclomine (BENTYL) 10 MG capsule Take 1 capsule (10 mg total) by mouth every 8 (eight) hours as needed for spasms.   norethindrone-ethinyl estradiol 1/35 (ORTHO-NOVUM) tablet Take 1 tablet by mouth daily.   ondansetron (ZOFRAN) 4 MG tablet Take 1 tablet (4 mg total) by mouth every 6 (six) hours as needed for nausea or vomiting. (Patient not taking: Reported on 12/19/2021)   pantoprazole (PROTONIX) 40 MG tablet Take 1 tablet (40 mg total) by mouth daily. (Patient not taking: Reported on 12/19/2021)   [DISCONTINUED] doxycycline (VIBRA-TABS) 100 MG tablet Take 1  tablet (100 mg total) by mouth 2 (two) times daily. (Patient not taking: Reported on 12/19/2021)   No facility-administered encounter medications on file as of 02/05/2022.    Allergies as of 02/05/2022 - Review Complete 12/19/2021  Allergen Reaction Noted   Lactose intolerance (gi) Other (See Comments) 05/08/2012   Sulfa antibiotics Itching and Swelling 07/25/2020   Ibuprofen  08/09/2020    Past Medical History:  Diagnosis Date   Chest pain 03/06/2013   Saw [facility] cardiolgy for CP on exertion in cold weather; cleared for sports, no restrictions   Cholecystitis    Colitis    History of gonorrhea    Proctitis    Ulcerative colitis ([place])     Past Surgical History:  Procedure Laterality Date   EYE SURGERY Bilateral    age 19   TONSILECTOMY/ADENOIDECTOMY WITH MYRINGOTOMY      Family History  Problem Relation Age of Onset   Hypertension Mother    Healthy Father    Other Maternal Grandmother        stomach problems   Irritable bowel syndrome Maternal Grandmother    Diabetes Other    Diabetes Paternal Aunt    Vision loss Paternal Grandmother    Diabetes Paternal Grandmother    Heart disease Maternal Grandfather    Heart disease Maternal Uncle    Inflammatory bowel disease Neg Hx     Social History   Socioeconomic History   Marital status: Single    Spouse name: Not on file  Number of children: 1   Years of education: Not on file   Highest education level: Not on file  Occupational History   Occupation: [employer]  Tobacco Use   Smoking status: Never   Smokeless tobacco: Never  Vaping Use   Vaping Use: Never used  Substance and Sexual Activity   Alcohol use: No    Alcohol/week: 0.0 standard drinks of alcohol   Drug use: No   Sexual activity: Yes    Partners: Male    Birth control/protection: Pill  Other Topics Concern   Not on file  Social History Narrative   8th grade   Social Determinants of Health   Financial Resource Strain: Low Risk   (02/06/2019)   Overall Financial Resource Strain (CARDIA)    Difficulty of Paying Living Expenses: Not hard at all  Food Insecurity: No Food Insecurity (02/06/2019)   Hunger Vital Sign    Worried About Running Out of Food in the Last Year: Never true    [place] in the Last Year: Never true  Transportation Needs: No Transportation Needs (02/06/2019)   PRAPARE - Hydrologist (Medical): No    Lack of Transportation (Non-Medical): No  Physical Activity: Unknown (02/06/2019)   Exercise Vital Sign    Days of Exercise per Week: Patient refused    Minutes of Exercise per Session: Patient refused  Stress: No Stress Concern Present (02/06/2019)   [place]    Feeling of Stress : Not at all  Social Connections: Unknown (02/06/2019)   Social Connection and Isolation Panel [NHANES]    Frequency of Communication with Friends and Family: Patient refused    Frequency of Social Gatherings with Friends and Family: Patient refused    Attends Religious Services: Patient refused    Active Member of Clubs or Organizations: Patient refused    Attends Archivist Meetings: Patient refused    Marital Status: Patient refused  Intimate Partner Violence: Not At Risk (02/06/2019)   Humiliation, Afraid, Rape, and Kick questionnaire    Fear of Current or Ex-Partner: No    Emotionally Abused: No    Physically Abused: No    Sexually Abused: No      Review of systems: All other review of systems negative except as mentioned in the HPI.   Physical Exam: Vitals:   02/05/22 0850  BP: 118/70  Pulse: 84  SpO2: 99%   Body mass index is 34.07 kg/m. Gen:      No acute distress HEENT:  sclera anicteric Abd:      soft, non-tender; no palpable masses, no distension Ext:    No edema Neuro: alert and oriented x 3 Psych: normal mood and affect  Data Reviewed:  Reviewed labs, radiology imaging,  old records and pertinent past GI work up   Assessment and Plan/Recommendations:  21 year old female with ulcerative colitis on maintenance therapy with complaints of worsening diarrhea and blood in stool concerning for acute UC flare Will need to exclude infectious etiology, check GI pathogen panel and C. Difficile Follow-up CBC, CMP, ESR, CRP, fecal calprotectin IBD health maintenance: Check folate, B12, iron panel, vitamin D and TB QuantiFERON gold Start mesalamine 4.8 g daily  If infectious etiology negative, will plan to start biologic or steroids to achieve remission Will also plan for colonoscopy to assess disease activity at next visit  Lower abdominal cramping: Use hyoscyamine every 8 hours as needed  Reinforced with patient  the need to be compliant with medications and follow-up  Return in 4 to 6 weeks  This visit required >40 minutes of patient care (this includes precharting, chart review, review of results, face-to-face time used for counseling as well as treatment plan and follow-up. The patient was provided an opportunity to ask questions and all were answered. The patient agreed with the plan and demonstrated an understanding of the instructions.  Damaris Hippo , MD    CC: No ref. provider found

## 2022-02-06 LAB — HEPATITIS B SURFACE ANTIGEN: Hepatitis B Surface Ag: NONREACTIVE

## 2022-02-06 LAB — HEPATITIS B CORE ANTIBODY, TOTAL: Hep B Core Total Ab: NONREACTIVE

## 2022-02-08 DIAGNOSIS — N926 Irregular menstruation, unspecified: Secondary | ICD-10-CM | POA: Diagnosis not present

## 2022-02-08 DIAGNOSIS — R110 Nausea: Secondary | ICD-10-CM | POA: Diagnosis not present

## 2022-02-09 ENCOUNTER — Other Ambulatory Visit (HOSPITAL_COMMUNITY): Payer: Self-pay

## 2022-02-09 ENCOUNTER — Encounter: Payer: Self-pay | Admitting: Gastroenterology

## 2022-02-10 ENCOUNTER — Other Ambulatory Visit (HOSPITAL_COMMUNITY): Payer: Self-pay

## 2022-02-11 ENCOUNTER — Other Ambulatory Visit (HOSPITAL_COMMUNITY): Payer: Self-pay

## 2022-02-11 ENCOUNTER — Other Ambulatory Visit: Payer: Medicaid Other

## 2022-02-11 DIAGNOSIS — R197 Diarrhea, unspecified: Secondary | ICD-10-CM

## 2022-02-11 DIAGNOSIS — K51911 Ulcerative colitis, unspecified with rectal bleeding: Secondary | ICD-10-CM | POA: Diagnosis not present

## 2022-02-11 LAB — QUANTIFERON-TB GOLD PLUS
Mitogen-NIL: >10 IU/mL
NIL: 0.22 IU/mL
QuantiFERON-TB Gold Plus: NEGATIVE
TB1-NIL: <0 IU/mL
TB2-NIL: 0.05 IU/mL

## 2022-02-11 NOTE — Telephone Encounter (Signed)
Received request from pharmacy. PA not required, called pharmacy to provide primary insurance information. [facility] pharmacy processed claim with a $10.00 copay.

## 2022-02-12 LAB — CLOSTRIDIUM DIFFICILE BY PCR: Toxigenic C. Difficile by PCR: NEGATIVE

## 2022-02-15 LAB — GI PROFILE, STOOL, PCR

## 2022-02-15 LAB — CALPROTECTIN, FECAL: Calprotectin, Fecal: 1980 ug/g — ABNORMAL HIGH (ref 0–120)

## 2022-02-16 ENCOUNTER — Telehealth: Payer: Self-pay | Admitting: Gastroenterology

## 2022-02-16 MED ORDER — PREDNISONE 10 MG PO TABS: ORAL_TABLET | ORAL | 0 refills | Status: AC

## 2022-02-16 NOTE — Telephone Encounter (Signed)
I spoke with Robin Garrison and confirmed the pharmacy to send in her prednisone rx. I went over the hyoscyamine directions. She requested that I email her the work letter for frequent bathroom breaks. I confirmed her email. She is aware of her early December appointment with our office.

## 2022-02-16 NOTE — Telephone Encounter (Signed)
[place] is requesting a note to turn into her job. She rebuilds school buses. They are requesting a note explaining that she needs to use the restroom frequently due to her medical condition. She said with her job if she moves to much or lifts a lot she finds then she needs to go to the restroom. Please advise, thank you.  She is also asking if she can increase her dosage of the hyoscyamine? It is TID prn and she finds it works but not 8 hours per dose. Please advise, thank you.

## 2022-02-16 NOTE — Telephone Encounter (Signed)
Inbound call from patient requesting a medical note for work due to having abdominal pain. Please advise patient.  Thank you

## 2022-02-16 NOTE — Telephone Encounter (Signed)
I am ok with providing patient with excuse letter or is there a FMLA I need to fill, do we have a generic letter that we can send to the patient? Don't recommend increasing the dose any further than every 8 hours for Levsin.  Please send Rx for prednisone 31m daily with taper dose, decrease by 135mevery week until she is down to 1034maily and then decrease to 5 mg daily for additional 7 days and stop.  Follow up in office visit as scheduled. Thank you

## 2022-02-18 ENCOUNTER — Ambulatory Visit: Payer: Medicaid Other | Admitting: Gastroenterology

## 2022-02-24 ENCOUNTER — Ambulatory Visit: Payer: Medicaid Other | Admitting: Physician Assistant

## 2022-03-07 ENCOUNTER — Other Ambulatory Visit: Payer: Self-pay | Admitting: Gastroenterology

## 2022-03-20 ENCOUNTER — Ambulatory Visit: Payer: Medicaid Other | Admitting: Nurse Practitioner

## 2022-03-26 ENCOUNTER — Ambulatory Visit: Payer: Medicaid Other | Admitting: Nurse Practitioner

## 2022-05-07 ENCOUNTER — Telehealth: Payer: Medicaid Other | Admitting: Physician Assistant

## 2022-05-07 DIAGNOSIS — B9689 Other specified bacterial agents as the cause of diseases classified elsewhere: Secondary | ICD-10-CM

## 2022-05-07 DIAGNOSIS — N760 Acute vaginitis: Secondary | ICD-10-CM

## 2022-05-07 MED ORDER — METRONIDAZOLE 0.75 % VA GEL: 1.0000 | Freq: Two times a day (BID) | VAGINAL | 0 refills | Status: AC

## 2022-05-07 NOTE — Progress Notes (Signed)
E-Visit for Vaginal Symptoms  We are sorry that you are not feeling well. Here is how we plan to help! Based on what you shared with me it looks like you: May have a vaginosis due to bacteria  Vaginosis is an inflammation of the vagina that can result in discharge, itching and pain. The cause is usually a change in the normal balance of vaginal bacteria or an infection. Vaginosis can also result from reduced estrogen levels after menopause.  The most common causes of vaginosis are:   Bacterial vaginosis which results from an overgrowth of one on several organisms that are normally present in your vagina.   Yeast infections which are caused by a naturally occurring fungus called candida.   Vaginal atrophy (atrophic vaginosis) which results from the thinning of the vagina from reduced estrogen levels after menopause.   Trichomoniasis which is caused by a parasite and is commonly transmitted by sexual intercourse.  Factors that increase your risk of developing vaginosis include: Medications, such as antibiotics and steroids Uncontrolled diabetes Use of hygiene products such as bubble bath, vaginal spray or vaginal deodorant Douching Wearing damp or tight-fitting clothing Using an intrauterine device (IUD) for birth control Hormonal changes, such as those associated with pregnancy, birth control pills or menopause Sexual activity Having a sexually transmitted infection  Your treatment plan is Metronidazole vaginal gel 7.84% One applicatorful twice daily for 5 days.  Be sure to take all of the medication as directed. Stop taking any medication if you develop a rash, tongue swelling or shortness of breath. Mothers who are breast feeding should consider pumping and discarding their breast milk while on these antibiotics. However, there is no consensus that infant exposure at these doses would be harmful.  Remember that medication creams can weaken latex condoms. Marland Kitchen   HOME CARE:  Good  hygiene may prevent some types of vaginosis from recurring and may relieve some symptoms:  Avoid baths, hot tubs and whirlpool spas. Rinse soap from your outer genital area after a shower, and dry the area well to prevent irritation. Don't use scented or harsh soaps, such as those with deodorant or antibacterial action. Avoid irritants. These include scented tampons and pads. Wipe from front to back after using the toilet. Doing so avoids spreading fecal bacteria to your vagina.  Other things that may help prevent vaginosis include:  Don't douche. Your vagina doesn't require cleansing other than normal bathing. Repetitive douching disrupts the normal organisms that reside in the vagina and can actually increase your risk of vaginal infection. Douching won't clear up a vaginal infection. Use a latex condom. Both female and female latex condoms may help you avoid infections spread by sexual contact. Wear cotton underwear. Also wear pantyhose with a cotton crotch. If you feel comfortable without it, skip wearing underwear to bed. Yeast thrives in [employer] Your symptoms should improve in the next day or two.  GET HELP RIGHT AWAY IF:  You have pain in your lower abdomen ( pelvic area or over your ovaries) You develop nausea or vomiting You develop a fever Your discharge changes or worsens You have persistent pain with intercourse You develop shortness of breath, a rapid pulse, or you faint.  These symptoms could be signs of problems or infections that need to be evaluated by a medical provider now.  MAKE SURE YOU   Understand these instructions. Will watch your condition. Will get help right away if you are not doing well or get worse.  Thank you  for choosing an e-visit.  Your e-visit answers were reviewed by a board certified advanced clinical practitioner to complete your personal care plan. Depending upon the condition, your plan could have included both over the counter or  prescription medications.  Please review your pharmacy choice. Make sure the pharmacy is open so you can pick up prescription now. If there is a problem, you may contact your provider through [employer] and have the prescription routed to another pharmacy.  Your safety is important to [place]. If you have drug allergies check your prescription carefully.   For the next 24 hours you can use MyChart to ask questions about today's visit, request a non-urgent call back, or ask for a work or school excuse. You will get an email in the next two days asking about your experience. I hope that your e-visit has been valuable and will speed your recovery.  I have spent 5 minutes in review of e-visit questionnaire, review and updating patient chart, medical decision making and response to patient.   Mar Daring, PA-C

## 2022-06-05 ENCOUNTER — Other Ambulatory Visit: Payer: Self-pay

## 2022-06-05 ENCOUNTER — Ambulatory Visit: Payer: Medicaid Other | Admitting: Gastroenterology

## 2022-06-17 ENCOUNTER — Ambulatory Visit: Payer: Medicaid Other | Admitting: Obstetrics and Gynecology

## 2022-06-30 ENCOUNTER — Encounter: Payer: Self-pay | Admitting: Obstetrics & Gynecology

## 2022-06-30 ENCOUNTER — Other Ambulatory Visit (HOSPITAL_COMMUNITY)
Admission: RE | Admit: 2022-06-30 | Discharge: 2022-06-30 | Disposition: A | Discharge status: Home / Self Care | Payer: Medicaid Other | Source: Ambulatory Visit | Attending: Obstetrics and Gynecology | Admitting: Obstetrics and Gynecology

## 2022-06-30 ENCOUNTER — Ambulatory Visit (INDEPENDENT_AMBULATORY_CARE_PROVIDER_SITE_OTHER): Payer: Medicaid Other | Admitting: Obstetrics & Gynecology

## 2022-06-30 VITALS — BP 116/74 | HR 86 | Ht 62.0 in | Wt 176.0 lb

## 2022-06-30 DIAGNOSIS — Z0000 Encounter for general adult medical examination without abnormal findings: Secondary | ICD-10-CM

## 2022-06-30 DIAGNOSIS — Z3041 Encounter for surveillance of contraceptive pills: Secondary | ICD-10-CM | POA: Diagnosis not present

## 2022-06-30 MED ORDER — NORETHINDRONE-ETH ESTRADIOL 1-35 MG-MCG PO TABS: 1.0000 | ORAL_TABLET | Freq: Every day | ORAL | 4 refills | Status: DC

## 2022-06-30 NOTE — Progress Notes (Signed)
Patient ID: Robin Garrison, female   DOB: Jun 14, 2000, 22 y.o.   MRN: [id number]  Chief Complaint  Patient presents with   Annual Exam  F/u oral contraceptives and do STD screen  HPI Robin Garrison is a 22 y.o. female.  [id number] Patient's last menstrual period was 06/22/2022. She is doing well on OCP and will continue. STD screen requested without specific concerns. HPI  Past Medical History:  Diagnosis Date   Chest pain 03/06/2013   Saw [facility] for CP on exertion in cold weather; cleared for sports, no restrictions   Cholecystitis    Colitis    History of gonorrhea    Proctitis    Ulcerative colitis ([place])     Past Surgical History:  Procedure Laterality Date   EYE SURGERY Bilateral    age 15   TONSILECTOMY/ADENOIDECTOMY WITH MYRINGOTOMY      Family History  Problem Relation Age of Onset   Hypertension Mother    Healthy Father    Other Maternal Grandmother        stomach problems   Irritable bowel syndrome Maternal Grandmother    Diabetes Other    Diabetes Paternal Aunt    Vision loss Paternal Grandmother    Diabetes Paternal Grandmother    Heart disease Maternal Grandfather    Heart disease Maternal Uncle    Inflammatory bowel disease Neg Hx     Social History Social History   Tobacco Use   Smoking status: Never   Smokeless tobacco: Never  Vaping Use   Vaping Use: Never used  Substance Use Topics   Alcohol use: No    Alcohol/week: 0.0 standard drinks of alcohol   Drug use: No    Allergies  Allergen Reactions   Lactose Intolerance (Gi) Other (See Comments)    Upset stomach   Sulfa Antibiotics Itching and Swelling    Burning    Ibuprofen     Hemorrhage per pt     Current Outpatient Medications  Medication Sig Dispense Refill   hyoscyamine (LEVSIN SL) 0.125 MG SL tablet PLACE 1 TABLET (0.125 MG TOTAL) UNDER THE TONGUE 3 (THREE) TIMES DAILY AS NEEDED. 270 tablet 1   mesalamine (LIALDA) 1.2 g EC tablet Take 4 tablets (4.8 g total) by mouth  daily with breakfast. 120 tablet 3   norethindrone-ethinyl estradiol 1/35 (ORTHO-NOVUM) tablet Take 1 tablet by mouth daily. 90 tablet 4   ondansetron (ZOFRAN) 4 MG tablet Take 1 tablet (4 mg total) by mouth every 6 (six) hours as needed for nausea or vomiting. (Patient not taking: Reported on 12/19/2021) 40 tablet 1   pantoprazole (PROTONIX) 40 MG tablet Take 1 tablet (40 mg total) by mouth daily. (Patient not taking: Reported on 12/19/2021) 30 tablet 2   No current facility-administered medications for this visit.    Review of Systems Review of Systems  Genitourinary: Negative.   All other systems reviewed and are negative.   Blood pressure 116/74, pulse 86, height '5\' 2"'$  (1.575 m), weight 79.8 kg, last menstrual period 06/22/2022.  Physical Exam Physical Exam Constitutional:      Appearance: Normal appearance.  HENT:     Head: Normocephalic and atraumatic.  Cardiovascular:     Rate and Rhythm: Normal rate.  Pulmonary:     Effort: Pulmonary effort is normal.  Musculoskeletal:        General: Normal range of motion.     Cervical back: Normal range of motion.  Neurological:     General: No focal deficit present.  Mental Status: She is alert.  Psychiatric:        Mood and Affect: Mood normal.        Behavior: Behavior normal.     Data Reviewed Pap 05/2021 normal  Assessment Doing well on OCP Well woman exam without gynecological exam - Plan: Cervicovaginal ancillary only( [place]), HIV Antibody (routine testing w rflx), Hepatitis B surface antigen, RPR, Hepatitis C antibody  Oral contraceptive pill surveillance  Encounter for birth control pills maintenance - Plan: norethindrone-ethinyl estradiol 1/35 (ORTHO-NOVUM) tablet   Plan STD screening completed OCP refilled    Emeterio Reeve 06/30/2022, 4:02 PM

## 2022-07-01 LAB — CERVICOVAGINAL ANCILLARY ONLY
Bacterial Vaginitis (gardnerella): NEGATIVE
Candida Glabrata: NEGATIVE
Candida Vaginitis: NEGATIVE
Chlamydia: NEGATIVE
Comment: NEGATIVE
Comment: NEGATIVE
Comment: NEGATIVE
Comment: NEGATIVE
Comment: NEGATIVE
Comment: NORMAL
Neisseria Gonorrhea: NEGATIVE
Trichomonas: NEGATIVE

## 2022-07-01 LAB — HIV ANTIBODY (ROUTINE TESTING W REFLEX): HIV Screen 4th Generation wRfx: NONREACTIVE

## 2022-07-01 LAB — HEPATITIS B SURFACE ANTIGEN: Hepatitis B Surface Ag: NEGATIVE

## 2022-07-01 LAB — HEPATITIS C ANTIBODY: Hep C Virus Ab: NONREACTIVE

## 2022-07-01 LAB — RPR: RPR Ser Ql: NONREACTIVE

## 2022-07-06 ENCOUNTER — Ambulatory Visit: Payer: Medicaid Other | Admitting: Nurse Practitioner

## 2022-07-22 ENCOUNTER — Telehealth: Payer: Medicaid Other | Admitting: Physician Assistant

## 2022-07-22 DIAGNOSIS — L858 Other specified epidermal thickening: Secondary | ICD-10-CM

## 2022-07-22 MED ORDER — CLOCORTOLONE PIVALATE 0.1 % EX CREA: 1.0000 | TOPICAL_CREAM | Freq: Two times a day (BID) | CUTANEOUS | 0 refills | Status: DC

## 2022-07-22 NOTE — Progress Notes (Signed)
E Visit for Rash  We are sorry that you are not feeling well. Here is how we plan to help!  It appears you may have a skin irritation called Keratosis Pilaris.  Keratosis pilaris (KP) is a genetic disorder of keratinization of hair follicles of the skin. It is an extremely common benign condition that manifests as small, rough folliculocentric keratotic papules, often described as chicken bumps, chicken skin, or goose-bumps, in characteristic areas of the body, particularly the outer-upper arms and thighs. Although no clear etiology has been defined, keratosis pilaris is often described in association with other dry skin conditions such as ichthyosis vulgaris, xerosis, and, less commonly, with atopic dermatitis, including conditions of asthma and allergies.  Keratosis pilaris  is a common disorder in both children and adults. It is frequently noted in otherwise asymptomatic patients visiting dermatologists for other conditions. Most people with keratosis pilaris are unaware the condition has a designated medical term or that it is treatable. In general, keratosis pilaris is frequently cosmetically displeasing but medically harmless.  Overall, keratosis pilaris is described as a condition of childhood and adolescence. Although it often becomes more exaggerated at puberty, it frequently improves with age. However, many adults have keratosis pilaris late into senescence. Approximately 30-50% of patients have a positive family history. Autosomal dominant inheritance with variable penetrance has been described. The autosomal recessive form of keratosis pilaris atrophicans is related to a desmoglein 4 mutation.  Seasonal variation is sometimes described, with improvement of symptoms in summer months. Dry skin in winter tends to worsen symptoms for some groups of patients. Overall, keratosis pilaris is self-limited and, again, tends to improve with age in many patients. Some patients have lifelong keratosis  pilaris with periods of remissions and exacerbations. More widespread atypical cases may be cosmetically disfiguring and psychologically distressing.  Many treatment options and skin care recipes are available for treating keratosis pilaris. Many patients have very good temporary improvement following a regular skin care program. As a general rule, treatment needs to be continuous. Because no single therapy is effective, the list of potential lotions and creams is long. Importantly, keep in mind that as with any condition, no therapy is uniformly effective in all people. Complete clearing may not be possible.  General measures to prevent excessive skin dryness, such as using mild soapless cleansers (eg, Dove, Cetaphil), are recommended, and lubrication is the mainstay of treatment for nearly all cases. Exfoliation is helpful in removing the small keratin plugs overlying follicles.  Best results may be achieved with combination therapy.  Mild cases of keratosis pilaris may be improved with basic lubrication using over-the-counter moisturizer lotions such as Cetaphil, Purpose, or Lubriderm.  I have prescribed Cloderm cream Apply topically to affected area twice daily for 7-10 days   HOME CARE:  Take cool showers and avoid direct sunlight. Apply cool compress or wet dressings. Take a bath in an oatmeal bath.  Sprinkle content of one Aveeno packet under running faucet with comfortably warm water.  Bathe for 15-20 minutes, 1-2 times daily.  Pat dry with a towel. Do not rub the rash. Use hydrocortisone cream. Take an antihistamine like Benadryl for widespread rashes that itch.  The adult dose of Benadryl is 25-50 mg by mouth 4 times daily. Caution:  This type of medication may cause sleepiness.  Do not drink alcohol, drive, or operate dangerous machinery while taking antihistamines.  Do not take these medications if you have prostate enlargement.  Read package instructions thoroughly on all medications  that  you take.  GET HELP RIGHT AWAY IF:  Symptoms don't go away after treatment. Severe itching that persists. If you rash spreads or swells. If you rash begins to smell. If it blisters and opens or develops a yellow-brown crust. You develop a fever. You have a sore throat. You become short of breath.  MAKE SURE YOU:  Understand these instructions. Will watch your condition. Will get help right away if you are not doing well or get worse.  Thank you for choosing an e-visit.  Your e-visit answers were reviewed by a board certified advanced clinical practitioner to complete your personal care plan. Depending upon the condition, your plan could have included both over the counter or prescription medications.  Please review your pharmacy choice. Make sure the pharmacy is open so you can pick up prescription now. If there is a problem, you may contact your provider through [employer] and have the prescription routed to another pharmacy.  Your safety is important to [place]. If you have drug allergies check your prescription carefully.   For the next 24 hours you can use MyChart to ask questions about today's visit, request a non-urgent call back, or ask for a work or school excuse. You will get an email in the next two days asking about your experience. I hope that your e-visit has been valuable and will speed your recovery.  I have spent 5 minutes in review of e-visit questionnaire, review and updating patient chart, medical decision making and response to patient.   Mar Daring, PA-C

## 2022-08-13 ENCOUNTER — Telehealth: Payer: BC Managed Care – PPO | Admitting: Physician Assistant

## 2022-08-13 DIAGNOSIS — Z3200 Encounter for pregnancy test, result unknown: Secondary | ICD-10-CM

## 2022-08-13 DIAGNOSIS — N912 Amenorrhea, unspecified: Secondary | ICD-10-CM | POA: Diagnosis not present

## 2022-08-13 DIAGNOSIS — N898 Other specified noninflammatory disorders of vagina: Secondary | ICD-10-CM | POA: Diagnosis not present

## 2022-08-13 NOTE — Progress Notes (Signed)
Because you are having abnormal vaginal discharge and think you my be pregnant, I feel your condition warrants further evaluation and I recommend that you be seen in a face to face visit.   NOTE: There will be NO CHARGE for this eVisit   If you are having a true medical emergency please call 911.      For an urgent face to face visit, [facility] has eight urgent care centers for your convenience:   NEW!! [facility] Urgent [facility] Center at [facility] Get Driving Directions [phone] [address] [place], [address]    [facility] Care Center at [facility] Get Driving Directions [phone] [address] [place], [place] [address]   [facility] Health Urgent Care Center [facility]) Get Driving Directions [phone] [address] [place], [place] [address]  [facility] Urgent Care Center [facility]) Get Driving Directions [phone] [address] [place],  [place]  [address]  [facility] Health Urgent Care Center [facility] - at [employer]  [phone] [place],  [place] [address]   [facility] Health Urgent Care at [facility] Get Driving Directions [phone] [address] [place], [place] [address]   [facility] Health Urgent Care at [facility] Get Driving Directions  [phone] [address] [place], [place] [address]   [facility] Health Urgent Care at [facility] Directions [phone] [address] [place], [place] [address]  Your MyChart E-visit questionnaire answers were reviewed by a board certified advanced clinical practitioner to complete your personal care plan based on your specific symptoms.  Thank you for using e-Visits.

## 2022-08-17 ENCOUNTER — Ambulatory Visit: Payer: BC Managed Care – PPO | Admitting: Obstetrics and Gynecology

## 2022-08-18 ENCOUNTER — Ambulatory Visit: Payer: BC Managed Care – PPO | Admitting: Obstetrics

## 2022-08-19 ENCOUNTER — Encounter: Payer: Self-pay | Admitting: Obstetrics and Gynecology

## 2022-08-19 ENCOUNTER — Other Ambulatory Visit (HOSPITAL_COMMUNITY)
Admission: RE | Admit: 2022-08-19 | Discharge: 2022-08-19 | Disposition: A | Discharge status: Home / Self Care | Payer: BC Managed Care – PPO | Source: Ambulatory Visit | Attending: Obstetrics and Gynecology | Admitting: Obstetrics and Gynecology

## 2022-08-19 ENCOUNTER — Ambulatory Visit (INDEPENDENT_AMBULATORY_CARE_PROVIDER_SITE_OTHER): Payer: BC Managed Care – PPO | Admitting: Obstetrics and Gynecology

## 2022-08-19 ENCOUNTER — Ambulatory Visit (HOSPITAL_BASED_OUTPATIENT_CLINIC_OR_DEPARTMENT_OTHER): Admission: RE | Admit: 2022-08-19 | Payer: BC Managed Care – PPO | Source: Ambulatory Visit

## 2022-08-19 VITALS — BP 115/73 | HR 78 | Ht 62.0 in | Wt 180.8 lb

## 2022-08-19 DIAGNOSIS — Z3200 Encounter for pregnancy test, result unknown: Secondary | ICD-10-CM

## 2022-08-19 DIAGNOSIS — N938 Other specified abnormal uterine and vaginal bleeding: Secondary | ICD-10-CM

## 2022-08-19 DIAGNOSIS — N898 Other specified noninflammatory disorders of vagina: Secondary | ICD-10-CM

## 2022-08-19 LAB — POCT URINE PREGNANCY: Preg Test, Ur: NEGATIVE

## 2022-08-19 MED ORDER — DESOGESTREL-ETHINYL ESTRADIOL 0.15-30 MG-MCG PO TABS
1.0000 | ORAL_TABLET | Freq: Every day | ORAL | 11 refills | Status: DC
Start: 2022-08-19 — End: 2023-08-20

## 2022-08-19 MED ORDER — PROGESTERONE 200 MG PO CAPS: 200.0000 mg | ORAL_CAPSULE | Freq: Every day | ORAL | 0 refills | Status: DC

## 2022-08-19 NOTE — Progress Notes (Signed)
Ms Miranda presents with DUB. Pt reports normal monthly cycles until this past March. March cycle only last 2 days. Normally her cycle last 4-5 days. Did not miss any pills. No cycle in April. Took 2 pills of next package and then stopped her OCP's Has a several negative home UPT and negative BHCG at [facility] Has pregnancy related Sx, bloating, nausea, tired and breast tenderness  PE AF VSS Lungs clear Heart RRR Abd soft + BS  UPT negative in office today  A/P DUB  ? Etiology but suspect hormonally related. Will check additional labs and GYN U/S. Progesterone withdraw and start Apri. Progesterone withdraw and restarting of OCP's reviewed with pt\Pt verbalized understanding F/U per test results and or in 2 months

## 2022-08-19 NOTE — Addendum Note (Signed)
Addended by: Jearld Adjutant on: 08/19/2022 11:30 AM   Modules accepted: Orders

## 2022-08-19 NOTE — Progress Notes (Signed)
Pt is in the office reporting LMP 06/28/22 and only lasted 2 days and was lighter. Reports that cycle is normally 4 days with heavier bleeding. Pt states that she has been consistently taking BC pills and never missed a pill. She recently stopped taking them on 08/11/22 to see if her cycle would come on and it has not.  Reports nausea, bloating, sore nipples, pelvic cramping, fatigue, but negative UPT at home.  Reports [facility] went to [facility] last week and had negative UPT and negative HCG.

## 2022-08-20 ENCOUNTER — Ambulatory Visit (HOSPITAL_BASED_OUTPATIENT_CLINIC_OR_DEPARTMENT_OTHER)
Admission: RE | Admit: 2022-08-20 | Discharge: 2022-08-20 | Disposition: A | Discharge status: Home / Self Care | Payer: BC Managed Care – PPO | Source: Ambulatory Visit | Attending: Obstetrics and Gynecology | Admitting: Obstetrics and Gynecology

## 2022-08-20 DIAGNOSIS — N938 Other specified abnormal uterine and vaginal bleeding: Secondary | ICD-10-CM | POA: Diagnosis not present

## 2022-08-20 DIAGNOSIS — N939 Abnormal uterine and vaginal bleeding, unspecified: Secondary | ICD-10-CM | POA: Diagnosis not present

## 2022-08-20 LAB — COMPREHENSIVE METABOLIC PANEL
ALT: 19 IU/L (ref 0–32)
AST: 22 IU/L (ref 0–40)
Albumin/Globulin Ratio: 1.5 (ref 1.2–2.2)
Albumin: 4.3 g/dL (ref 4.0–5.0)
Alkaline Phosphatase: 56 IU/L (ref 44–121)
BUN/Creatinine Ratio: 17 (ref 9–23)
BUN: 14 mg/dL (ref 6–20)
Bilirubin Total: 0.2 mg/dL (ref 0.0–1.2)
CO2: 22 mmol/L (ref 20–29)
Calcium: 10.2 mg/dL (ref 8.7–10.2)
Chloride: 102 mmol/L (ref 96–106)
Creatinine, Ser: 0.81 mg/dL (ref 0.57–1.00)
Globulin, Total: 2.9 g/dL (ref 1.5–4.5)
Glucose: 82 mg/dL (ref 70–99)
Potassium: 4.6 mmol/L (ref 3.5–5.2)
Sodium: 139 mmol/L (ref 134–144)
Total Protein: 7.2 g/dL (ref 6.0–8.5)
eGFR: 105 mL/min/{1.73_m2} (ref >59)

## 2022-08-20 LAB — CERVICOVAGINAL ANCILLARY ONLY
Bacterial Vaginitis (gardnerella): NEGATIVE
Candida Glabrata: NEGATIVE
Candida Vaginitis: NEGATIVE
Chlamydia: NEGATIVE
Comment: NEGATIVE
Comment: NEGATIVE
Comment: NEGATIVE
Comment: NEGATIVE
Comment: NEGATIVE
Comment: NORMAL
Neisseria Gonorrhea: NEGATIVE
Trichomonas: NEGATIVE

## 2022-08-20 LAB — CBC
Hematocrit: 38.2 % (ref 34.0–46.6)
Hemoglobin: 12.6 g/dL (ref 11.1–15.9)
MCH: 30.2 pg (ref 26.6–33.0)
MCHC: 33 g/dL (ref 31.5–35.7)
MCV: 92 fL (ref 79–97)
Platelets: 335 10*3/uL (ref 150–450)
RBC: 4.17 x10E6/uL (ref 3.77–5.28)
RDW: 12.2 % (ref 11.7–15.4)
WBC: 5.6 10*3/uL (ref 3.4–10.8)

## 2022-08-20 LAB — TSH: TSH: 2.49 u[IU]/mL (ref 0.450–4.500)

## 2022-08-21 ENCOUNTER — Ambulatory Visit: Payer: BC Managed Care – PPO

## 2022-09-10 ENCOUNTER — Telehealth: Payer: BC Managed Care – PPO | Admitting: Physician Assistant

## 2022-09-10 DIAGNOSIS — B3731 Acute candidiasis of vulva and vagina: Secondary | ICD-10-CM | POA: Diagnosis not present

## 2022-09-10 MED ORDER — FLUCONAZOLE 150 MG PO TABS
150.0000 mg | ORAL_TABLET | Freq: Once | ORAL | 0 refills | Status: AC
Start: 2022-09-10 — End: 2022-09-10

## 2022-09-10 NOTE — Progress Notes (Signed)

## 2022-09-10 NOTE — Progress Notes (Signed)
I have spent 5 minutes in review of e-visit questionnaire, review and updating patient chart, medical decision making and response to patient.   Calen Geister Cody Camaryn Lumbert, PA-C    

## 2022-11-09 DIAGNOSIS — Z113 Encounter for screening for infections with a predominantly sexual mode of transmission: Secondary | ICD-10-CM | POA: Diagnosis not present

## 2022-11-12 ENCOUNTER — Telehealth: Payer: BC Managed Care – PPO | Admitting: Physician Assistant

## 2022-11-12 DIAGNOSIS — B9689 Other specified bacterial agents as the cause of diseases classified elsewhere: Secondary | ICD-10-CM

## 2022-11-12 DIAGNOSIS — N760 Acute vaginitis: Secondary | ICD-10-CM

## 2022-11-12 MED ORDER — METRONIDAZOLE 500 MG PO TABS
500.0000 mg | ORAL_TABLET | Freq: Two times a day (BID) | ORAL | 0 refills | Status: DC
Start: 2022-11-12 — End: 2022-11-12

## 2022-11-12 MED ORDER — METRONIDAZOLE 0.75 % EX GEL
CUTANEOUS | 0 refills | Status: DC
Start: 2022-11-12 — End: 2023-08-12

## 2022-11-12 MED ORDER — FLUCONAZOLE 150 MG PO TABS: 150.0000 mg | ORAL_TABLET | Freq: Every day | ORAL | 0 refills | Status: DC

## 2022-11-12 NOTE — Progress Notes (Signed)

## 2022-11-12 NOTE — Addendum Note (Signed)
Addended by: Waldon Merl on: 11/12/2022 05:06 PM   Modules accepted: Orders

## 2022-11-12 NOTE — Progress Notes (Signed)
I have spent 5 minutes in review of e-visit questionnaire, review and updating patient chart, medical decision making and response to patient.   William Cody Martin, PA-C    

## 2022-11-12 NOTE — Addendum Note (Signed)
Addended by: Waldon Merl on: 11/12/2022 04:57 PM   Modules accepted: Orders

## 2022-11-19 ENCOUNTER — Ambulatory Visit: Payer: BC Managed Care – PPO

## 2023-01-25 ENCOUNTER — Other Ambulatory Visit (HOSPITAL_COMMUNITY)
Admission: RE | Admit: 2023-01-25 | Discharge: 2023-01-25 | Disposition: A | Discharge status: Home / Self Care | Payer: BC Managed Care – PPO | Source: Ambulatory Visit | Attending: Family Medicine | Admitting: Family Medicine

## 2023-01-25 ENCOUNTER — Ambulatory Visit: Payer: BC Managed Care – PPO | Admitting: Family Medicine

## 2023-01-25 ENCOUNTER — Encounter: Payer: Self-pay | Admitting: Family Medicine

## 2023-01-25 VITALS — BP 130/84 | HR 81 | Ht 62.0 in | Wt 184.5 lb

## 2023-01-25 DIAGNOSIS — Z113 Encounter for screening for infections with a predominantly sexual mode of transmission: Secondary | ICD-10-CM | POA: Diagnosis not present

## 2023-01-25 DIAGNOSIS — R1084 Generalized abdominal pain: Secondary | ICD-10-CM | POA: Insufficient documentation

## 2023-01-25 NOTE — Assessment & Plan Note (Signed)
Patient being evaluated today for intermittent abdominal cramping.  Pain in the lower quadrants bilaterally.  With description and physical exam unlikely to be gynecologic in etiology.  Likely GI related.  Discussed keeping a symptom diary to see if she can identify any causes of the cramping.  Also encouraged her to drink more fluids.  Discussed return precautions and follow-up as needed.  Also recommended she follow-up with her GI doctor.  No further questions or concerns.  Patient would like STD swabs and will evaluate for 8 yeast and BV as well.  Patient discharged home.

## 2023-01-25 NOTE — Progress Notes (Signed)
Pt. Presents for cramping off and on for 3 wks. Concerned about weight gain.

## 2023-01-25 NOTE — Progress Notes (Signed)
    SUBJECTIVE:   CHIEF COMPLAINT / HPI:   Abdominal cramping Patient presenting for evaluation due to abdominal cramping which has been off and on for the past 3 weeks.  She reports she has a known history of ulcerative colitis but the pain is usually in the upper abdomen.  Was seeing a GI specialist but has not seen them since the beginning of the year.  Reports this pain is in her right lower and left lower quadrants so she was worried it may be related to her uterus.  Reports normal menses.  Reports no abnormal vaginal discharge.  Has not been keeping a symptom diary but thinks it may be worse right after she eats.  Pain resolved spontaneously without treatment.  Patient reports she has bowel movements roughly twice a day but sometimes they are hard.  Reports that she does not drink much water throughout the day.  PERTINENT  PMH / PSH: Ulcerative colitis  OBJECTIVE:   BP 130/84   Pulse 81   Ht 5\' 2"  (1.575 m)   Wt 184 lb 8 oz (83.7 kg)   LMP 01/11/2023 (Approximate)   BMI 33.75 kg/m   General: Well-appearing 22 year old female in no acute distress Cardiac: Regular rate Respiratory: Normal work of breathing, speaking in full sentences Abdomen: Soft, nontender, no organomegaly, on palpation of the lower quadrants patient reports that this is where she is hurting.  ASSESSMENT/PLAN:   Generalized abdominal cramping Patient being evaluated today for intermittent abdominal cramping.  Pain in the lower quadrants bilaterally.  With description and physical exam unlikely to be gynecologic in etiology.  Likely GI related.  Discussed keeping a symptom diary to see if she can identify any causes of the cramping.  Also encouraged her to drink more fluids.  Discussed return precautions and follow-up as needed.  Also recommended she follow-up with her GI doctor.  No further questions or concerns.  Patient would like STD swabs and will evaluate for 8 yeast and BV as well.  Patient discharged home.      Celedonio Savage, MD [facility]

## 2023-01-26 LAB — CERVICOVAGINAL ANCILLARY ONLY
Bacterial Vaginitis (gardnerella): NEGATIVE
Candida Glabrata: NEGATIVE
Candida Vaginitis: NEGATIVE
Chlamydia: NEGATIVE
Comment: NEGATIVE
Comment: NEGATIVE
Comment: NEGATIVE
Comment: NEGATIVE
Comment: NEGATIVE
Comment: NORMAL
Neisseria Gonorrhea: NEGATIVE
Trichomonas: NEGATIVE

## 2023-04-11 ENCOUNTER — Telehealth: Payer: BC Managed Care – PPO | Admitting: Family Medicine

## 2023-04-11 DIAGNOSIS — R051 Acute cough: Secondary | ICD-10-CM | POA: Diagnosis not present

## 2023-04-11 MED ORDER — BENZONATATE 200 MG PO CAPS: 200.0000 mg | ORAL_CAPSULE | Freq: Two times a day (BID) | ORAL | 0 refills | Status: DC | PRN

## 2023-04-11 NOTE — Progress Notes (Signed)
E-Visit for Cough  We are sorry that you are not feeling well.  Here is how we plan to help!  Based on your presentation I believe you most likely have A cough due to a virus.  This is called viral bronchitis and is best treated by rest, plenty of fluids and control of the cough.  You may use Ibuprofen or Tylenol as directed to help your symptoms.     In addition you may use A prescription cough medication called Tessalon Perles 100mg. You may take 1-2 capsules every 8 hours as needed for your cough.    From your responses in the eVisit questionnaire you describe inflammation in the upper respiratory tract which is causing a significant cough.  This is commonly called Bronchitis and has four common causes:   Allergies Viral Infections Acid Reflux Bacterial Infection Allergies, viruses and acid reflux are treated by controlling symptoms or eliminating the cause. An example might be a cough caused by taking certain blood pressure medications. You stop the cough by changing the medication. Another example might be a cough caused by acid reflux. Controlling the reflux helps control the cough.  USE OF BRONCHODILATOR ("RESCUE") INHALERS: There is a risk from using your bronchodilator too frequently.  The risk is that over-reliance on a medication which only relaxes the muscles surrounding the breathing tubes can reduce the effectiveness of medications prescribed to reduce swelling and congestion of the tubes themselves.  Although you feel brief relief from the bronchodilator inhaler, your asthma may actually be worsening with the tubes becoming more swollen and filled with mucus.  This can delay other crucial treatments, such as oral steroid medications. If you need to use a bronchodilator inhaler daily, several times per day, you should discuss this with your provider.  There are probably better treatments that could be used to keep your asthma under control.     HOME CARE Only take medications as  instructed by your medical team. Complete the entire course of an antibiotic. Drink plenty of fluids and get plenty of rest. Avoid close contacts especially the very young and the elderly Cover your mouth if you cough or cough into your sleeve. Always remember to wash your hands A steam or ultrasonic humidifier can help congestion.   GET HELP RIGHT AWAY IF: You develop worsening fever. You become short of breath You cough up blood. Your symptoms persist after you have completed your treatment plan MAKE SURE YOU  Understand these instructions. Will watch your condition. Will get help right away if you are not doing well or get worse.    Thank you for choosing an e-visit.  Your e-visit answers were reviewed by a board certified advanced clinical practitioner to complete your personal care plan. Depending upon the condition, your plan could have included both over the counter or prescription medications.  Please review your pharmacy choice. Make sure the pharmacy is open so you can pick up prescription now. If there is a problem, you may contact your provider through MyChart messaging and have the prescription routed to another pharmacy.  Your safety is important to us. If you have drug allergies check your prescription carefully.   For the next 24 hours you can use MyChart to ask questions about today's visit, request a non-urgent call back, or ask for a work or school excuse. You will get an email in the next two days asking about your experience. I hope that your e-visit has been valuable and will speed your recovery.      have provided 5 minutes of non face to face time during this encounter for chart review and documentation.   

## 2023-04-15 ENCOUNTER — Telehealth: Payer: BC Managed Care – PPO | Admitting: Family Medicine

## 2023-04-15 ENCOUNTER — Telehealth: Payer: BC Managed Care – PPO | Admitting: Physician Assistant

## 2023-04-15 DIAGNOSIS — L858 Other specified epidermal thickening: Secondary | ICD-10-CM

## 2023-04-15 DIAGNOSIS — B9689 Other specified bacterial agents as the cause of diseases classified elsewhere: Secondary | ICD-10-CM

## 2023-04-15 MED ORDER — TRETINOIN 0.1 % EX CREA: TOPICAL_CREAM | CUTANEOUS | 0 refills | Status: DC

## 2023-04-15 MED ORDER — TRETINOIN 0.1 % EX CREA: TOPICAL_CREAM | CUTANEOUS | 0 refills | Status: AC

## 2023-04-15 MED ORDER — PREDNISONE 10 MG (21) PO TBPK: ORAL_TABLET | ORAL | 0 refills | Status: DC

## 2023-04-15 MED ORDER — AZITHROMYCIN 250 MG PO TABS: ORAL_TABLET | ORAL | 0 refills | Status: AC

## 2023-04-15 NOTE — Progress Notes (Signed)
E Visit for Rash  We are sorry that you are not feeling well. Here is how we plan to help!  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   Best course of treatment is using regular skin exfoliators and moisturizing well.   I have also prescribed Tretinoin to apply topically twice weekly.    HOME CARE:  Take cool showers and avoid direct sunlight. Apply cool compress or wet dressings. Take a bath in an oatmeal bath.  Sprinkle content of one Aveeno packet under running faucet with comfortably warm water.  Bathe for 15-20 minutes, 1-2 times daily.  Pat dry with a towel. Do not rub the rash. Use hydrocortisone cream. Take an antihistamine like Benadryl for widespread rashes that itch.  The adult dose of Benadryl is 25-50 mg by mouth 4 times daily. Caution:  This type of medication may cause sleepiness.  Do not drink alcohol, drive, or operate dangerous machinery while taking antihistamines.  Do not take these medications if you have prostate enlargement.  Read package instructions thoroughly on all medications that you take.  GET HELP RIGHT AWAY IF:  Symptoms don't go away after treatment. Severe itching that persists. If you rash spreads or swells. If you rash begins to smell. If it blisters and opens or develops a yellow-brown crust. You develop a fever. You have a sore throat. You become short of breath.  MAKE SURE YOU:  Understand these instructions. Will watch your condition. Will get help right away if you are not doing well or get worse.  Thank you for choosing an e-visit.  Your e-visit answers were reviewed by a board certified advanced clinical practitioner to complete your personal care plan. Depending upon the condition, your plan could have included both over the counter or prescription medications.  Please review your pharmacy choice. Make sure the pharmacy is open so you can pick up prescription now. If there is a problem, you may  contact your provider through [employer] and have the prescription routed to another pharmacy.  Your safety is important to [place]. If you have drug allergies check your prescription carefully.   For the next 24 hours you can use MyChart to ask questions about today's visit, request a non-urgent call back, or ask for a work or school excuse. You will get an email in the next two days asking about your experience. I hope that your e-visit has been valuable and will speed your recovery.   I have spent 5 minutes in review of e-visit questionnaire, review and updating patient chart, medical decision making and response to patient.   Margaretann Loveless, PA-C

## 2023-04-15 NOTE — Progress Notes (Signed)
E-Visit for Cough   We are sorry that you are not feeling well.  Here is how we plan to help!  Based on your presentation I believe you most likely have A cough due to bacteria.  When patients have a fever and a productive cough with a change in color or increased sputum production, we are concerned about bacterial bronchitis.  If left untreated it can progress to pneumonia.  If your symptoms do not improve with your treatment plan it is important that you contact your provider.   I have prescribed Azithromyin 250 mg: two tablets now and then one tablet daily for 4 additonal days    Prednisone 10 mg daily for 6 days (see taper instructions below)  Directions for 6 day taper: Day 1: 2 tablets before breakfast, 1 after both lunch & dinner and 2 at bedtime Day 2: 1 tab before breakfast, 1 after both lunch & dinner and 2 at bedtime Day 3: 1 tab at each meal & 1 at bedtime Day 4: 1 tab at breakfast, 1 at lunch, 1 at bedtime Day 5: 1 tab at breakfast & 1 tab at bedtime Day 6: 1 tab at breakfast  From your responses in the eVisit questionnaire you describe inflammation in the upper respiratory tract which is causing a significant cough.  This is commonly called Bronchitis and has four common causes:   Allergies Viral Infections Acid Reflux Bacterial Infection Allergies, viruses and acid reflux are treated by controlling symptoms or eliminating the cause. An example might be a cough caused by taking certain blood pressure medications. You stop the cough by changing the medication. Another example might be a cough caused by acid reflux. Controlling the reflux helps control the cough.  USE OF BRONCHODILATOR ("RESCUE") INHALERS: There is a risk from using your bronchodilator too frequently.  The risk is that over-reliance on a medication which only relaxes the muscles surrounding the breathing tubes can reduce the effectiveness of medications prescribed to reduce swelling and congestion of the tubes  themselves.  Although you feel brief relief from the bronchodilator inhaler, your asthma may actually be worsening with the tubes becoming more swollen and filled with mucus.  This can delay other crucial treatments, such as oral steroid medications. If you need to use a bronchodilator inhaler daily, several times per day, you should discuss this with your provider.  There are probably better treatments that could be used to keep your asthma under control.     HOME CARE Only take medications as instructed by your medical team. Complete the entire course of an antibiotic. Drink plenty of fluids and get plenty of rest. Avoid close contacts especially the very young and the elderly Cover your mouth if you cough or cough into your sleeve. Always remember to wash your hands A steam or ultrasonic humidifier can help congestion.   GET HELP RIGHT AWAY IF: You develop worsening fever. You become short of breath You cough up blood. Your symptoms persist after you have completed your treatment plan MAKE SURE YOU  Understand these instructions. Will watch your condition. Will get help right away if you are not doing well or get worse.    Thank you for choosing an e-visit.  Your e-visit answers were reviewed by a board certified advanced clinical practitioner to complete your personal care plan. Depending upon the condition, your plan could have included both over the counter or prescription medications.  Please review your pharmacy choice. Make sure the pharmacy is open so  you can pick up prescription now. If there is a problem, you may contact your provider through [employer] and have the prescription routed to another pharmacy.  Your safety is important to [place]. If you have drug allergies check your prescription carefully.   For the next 24 hours you can use MyChart to ask questions about today's visit, request a non-urgent call back, or ask for a work or school excuse. You will get an email  in the next two days asking about your experience. I hope that your e-visit has been valuable and will speed your recovery.  I provided 5 minutes of non face-to-face time during this encounter for chart review, medication and order placement, as well as and documentation.

## 2023-04-15 NOTE — Addendum Note (Signed)
Addended by: Margaretann Loveless on: 04/15/2023 06:16 PM   Modules accepted: Orders

## 2023-04-22 DIAGNOSIS — Z113 Encounter for screening for infections with a predominantly sexual mode of transmission: Secondary | ICD-10-CM | POA: Diagnosis not present

## 2023-05-16 ENCOUNTER — Encounter: Payer: Self-pay | Admitting: Gastroenterology

## 2023-05-24 NOTE — Progress Notes (Deleted)
 ANNUAL EXAM Patient name: Robin Garrison MRN [id number]  Date of birth: [birth year] Chief Complaint:   No chief complaint on file.  History of Present Illness:   Robin Garrison is a 23 y.o. G91P1001 {race:25618} female being seen today for a routine annual exam.  Current complaints: ***  No LMP recorded. (Menstrual status: Oral contraceptives).   The pregnancy intention screening data noted above was reviewed. Potential methods of contraception were discussed. The patient elected to proceed with No data recorded.   Last pap 06/16/21-NILM, 05/03/20-NILM.  H/O abnormal pap: {yes/yes***/no:23866} Last mammogram: ***. Results were: {normal, abnormal, n/a:23837}. Family h/o breast cancer: {yes***/no:23838} Last colonoscopy: ***. Results were: {normal, abnormal, n/a:23837}. Family h/o colorectal cancer: {yes***/no:23838} STI testing:  Contraception:      06/30/2022    3:41 PM 06/16/2021   10:40 AM  Depression screen PHQ 2/9  Decreased Interest 0 0  Down, Depressed, Hopeless 0 0  PHQ - 2 Score 0 0  Altered sleeping  0  Tired, decreased energy  1  Change in appetite  0  Feeling bad or failure about yourself   0  Trouble concentrating  0  Moving slowly or fidgety/restless  0  Suicidal thoughts  0  PHQ-9 Score  1  Difficult doing work/chores  Not difficult at all         No data to display           Review of Systems:   Pertinent items are noted in HPI Denies any headaches, blurred vision, fatigue, shortness of breath, chest pain, abdominal pain, abnormal vaginal discharge/itching/odor/irritation, problems with periods, bowel movements, urination, or intercourse unless otherwise stated above. Pertinent History Reviewed:  Reviewed past medical,surgical, social and family history.  Reviewed problem list, medications and allergies. Physical Assessment:  There were no vitals filed for this visit.There is no height or weight on file to calculate BMI.        Physical  Examination:   General appearance - well appearing, and in no distress  Mental status - alert, oriented to person, place, and time  Psych:  She has a normal mood and affect  Skin - warm and dry, normal color, no suspicious lesions noted  Chest - effort normal, all lung fields clear to auscultation bilaterally  Heart - normal rate and regular rhythm  Neck:  midline trachea, no thyromegaly or nodules  Breasts - breasts appear normal, no suspicious masses, no skin or nipple changes or  axillary nodes  Abdomen - soft, nontender, nondistended, no masses or organomegaly  Pelvic - VULVA: normal appearing vulva with no masses, tenderness or lesions  VAGINA: normal appearing vagina with normal color and discharge, no lesions  CERVIX: normal appearing cervix without discharge or lesions, no CMT  Thin prep pap is {Desc; done/not:10129} *** HR HPV cotesting  UTERUS: uterus is felt to be normal size, shape, consistency and nontender   ADNEXA: No adnexal masses or tenderness noted.  Rectal - normal rectal, good sphincter tone, no masses felt. Hemoccult: ***  Extremities:  No swelling or varicosities noted  Chaperone present for exam  No results found for this or any previous visit (from the past 24 hours).  Assessment & Plan:  1. Encounter for annual routine gynecological examination (Primary) - GC/CT: Discussed and recommended. Pt  {Blank single:19197::accepts,declines} - Gardasil: {Blank single:19197::***,has not yet had. Will provide information,completed,has not yet had. Counseling provided and she declines,Has not yet had. Counseling provided and pt accepts} - Birth Control: {Birth control type:23956} -  Breast Health: Encouraged self breast awareness/exams. Teaching provided. - Mammogram: {Mammo f/u:25212::@ 25yo}, or sooner if problems - Colonoscopy: {TCS f/u:25213::@ 25yo}, or sooner if problems - Follow-up: 12 months and prn   2. Cervical cancer screening Last pap 06/16/21  with no history of abnormal pap. Repeat 05/2024.   Discussed screening Q3 years. Reviewed importance of annual exams and limits of pap smear.     No orders of the defined types were placed in this encounter.   Meds: No orders of the defined types were placed in this encounter.   Follow-up: No follow-ups on file.  Arleny Kruger E Riccardo Holeman, PA-C 05/24/2023 8:30 AM

## 2023-05-26 ENCOUNTER — Ambulatory Visit: Payer: BC Managed Care – PPO | Admitting: Physician Assistant

## 2023-05-26 DIAGNOSIS — Z124 Encounter for screening for malignant neoplasm of cervix: Secondary | ICD-10-CM

## 2023-05-26 DIAGNOSIS — Z01419 Encounter for gynecological examination (general) (routine) without abnormal findings: Secondary | ICD-10-CM

## 2023-07-09 ENCOUNTER — Ambulatory Visit: Payer: BC Managed Care – PPO | Admitting: Obstetrics and Gynecology

## 2023-07-22 DIAGNOSIS — N906 Unspecified hypertrophy of vulva: Secondary | ICD-10-CM | POA: Diagnosis not present

## 2023-08-01 ENCOUNTER — Encounter: Admitting: Nurse Practitioner

## 2023-08-01 DIAGNOSIS — L819 Disorder of pigmentation, unspecified: Secondary | ICD-10-CM

## 2023-08-01 NOTE — Progress Notes (Signed)
 I have spent 5 minutes in review of e-visit questionnaire, review and updating patient chart, medical decision making and response to patient.   Claiborne Rigg, NP

## 2023-08-01 NOTE — Progress Notes (Signed)
 Because your skin condition would not be considered a general acne problem due to discoloration and hypopigmentation areas on your face.  I feel your condition warrants further evaluation and I recommend that you be seen for a face to face visit.  Also we do not treat for chronic skin conditions and it appears you have been having issues with your skin since last year.   Please get established with a primary care physician practice to be seen. Then they can refer you to a dermatologist that is able to treat chronic skin conditions.    NOTE: You will NOT be charged for this eVisit.  If you do not have a PCP, [facility] offers a free physician referral service available at [phone]. Our trained staff has the experience, knowledge and resources to put you in touch with a physician who is right for you.    If you are having a true medical emergency please call 911.   Your e-visit answers were reviewed by a board certified advanced clinical practitioner to complete your personal care plan.  Thank you for using e-Visits.

## 2023-08-12 ENCOUNTER — Ambulatory Visit: Admitting: Obstetrics and Gynecology

## 2023-08-12 ENCOUNTER — Other Ambulatory Visit (HOSPITAL_COMMUNITY)
Admission: RE | Admit: 2023-08-12 | Discharge: 2023-08-12 | Disposition: A | Discharge status: Home / Self Care | Source: Ambulatory Visit | Attending: Obstetrics and Gynecology | Admitting: Obstetrics and Gynecology

## 2023-08-12 ENCOUNTER — Encounter: Payer: Self-pay | Admitting: Obstetrics and Gynecology

## 2023-08-12 VITALS — BP 111/76 | HR 82 | Ht 62.0 in | Wt 168.0 lb

## 2023-08-12 DIAGNOSIS — K5190 Ulcerative colitis, unspecified, without complications: Secondary | ICD-10-CM | POA: Diagnosis not present

## 2023-08-12 DIAGNOSIS — Z113 Encounter for screening for infections with a predominantly sexual mode of transmission: Secondary | ICD-10-CM | POA: Diagnosis not present

## 2023-08-12 DIAGNOSIS — Z01419 Encounter for gynecological examination (general) (routine) without abnormal findings: Secondary | ICD-10-CM | POA: Diagnosis not present

## 2023-08-12 DIAGNOSIS — N898 Other specified noninflammatory disorders of vagina: Secondary | ICD-10-CM | POA: Diagnosis not present

## 2023-08-12 MED ORDER — NORETHINDRONE-ETH ESTRADIOL 1-35 MG-MCG PO TABS: 1.0000 | ORAL_TABLET | Freq: Every day | ORAL | 4 refills | Status: DC

## 2023-08-12 NOTE — Progress Notes (Signed)
 ANNUAL EXAM Patient name: Robin Garrison MRN [id number]  Date of birth: 2000/06/20 Chief Complaint:   Annual Exam  History of Present Illness:   Robin Garrison is a 23 y.o. G1P1001 with No LMP recorded. (Menstrual status: Oral contraceptives). being seen today for a routine annual exam.  Current complaints:   Vaginal dryness - chronic. is an issue with intercourse but even just on the day to day. Has tried aquaphor prn with some temporary relief. Coconut oil not helpful. Had burning with one of the lubricants she tried. Uses a water based lubricant which helps but wears off quickly. Has really sensitive skin.   Works third shift, builds buses.   2 bumps - 1 on her bottom and one on vulva, wondering if a piece of metal got caught from her job. Can't get them to go away despite different rx's from her PCP   [facility] - 08/12/23 1609       Pregnancy Intention Screening   Does the patient want to become pregnant in the next year? No    Does the patient's partner want to become pregnant in the next year? N/A    Would the patient like to discuss contraceptive options today? No      Contraception Wrap Up   Current Method Oral Contraceptive    End Method Oral Contraceptive    Contraception Counseling Provided No    How was the end contraceptive method provided? Prescription            The pregnancy intention screening data noted above was reviewed. Potential methods of contraception were discussed. The patient elected to proceed with Oral Contraceptive.   Last pap 06/16/21. Results were:  NILM . H/O abnormal pap: no Last mammogram: n/a. Family h/o breast cancer: no Last colonoscopy: 2021 - ulcerative colitis, needs to re-establish care with GI HPV vaccine: Completed 2017     06/30/2022    3:41 PM 06/16/2021   10:40 AM  Depression screen PHQ 2/9  Decreased Interest 0 0  Down, Depressed, Hopeless 0 0  PHQ - 2 Score 0 0  Altered sleeping  0  Tired, decreased energy  1  Change  in appetite  0  Feeling bad or failure about yourself   0  Trouble concentrating  0  Moving slowly or fidgety/restless  0  Suicidal thoughts  0  PHQ-9 Score  1  Difficult doing work/chores  Not difficult at all         No data to display         Review of Systems:   Pertinent items are noted in HPI  Pertinent History Reviewed:  Reviewed past medical,surgical, social and family history.  Reviewed problem list, medications and allergies. Physical Assessment:   Vitals:   08/12/23 1534  BP: 111/76  Pulse: 82  Weight: 168 lb (76.2 kg)  Height: 5\' 2"  (1.575 m)  Body mass index is 30.73 kg/m.        Physical Examination:   General appearance - well appearing, and in no distress  Mental status - alert, oriented to person, place, and time  Chest - respiratory effort normal  Heart - normal peripheral perfusion  Breasts - breasts appear normal, no suspicious masses, no skin or nipple changes or axillary nodes  Abdomen - soft, nontender, nondistended, no masses or organomegaly  Pelvic - VULVA: normal appearing vulva with no masses, tenderness or lesions  VAGINA: normal appearing vagina with normal color and discharge, no lesions  Chaperone present for exam  No results found for this or any previous visit (from the past 24 hours).  Assessment & Plan:  1) Well-Woman Exam Mammogram: @ 25yo, or sooner if problems Colonoscopy: per GI - referral placed for her to re-establish care Pap: Due 05/2024 Gardasil: Completed GC/CT: Collected HIV/HCV: Ordered  2) Vaginal dryness Will try increasing estrogen dose with OCP to see if there is any improvement. Will give it three cycles and if no difference, will call to schedule follow up appt Topical emollient like aquaphor daily for external dryness Discussed trying uberlube to see if that works better for intercourse  Labs/procedures today:   Orders Placed This Encounter  Procedures   HIV antibody (with reflex)   Hepatitis C  Antibody   Hepatitis B Surface AntiGEN   RPR    Meds:  Meds ordered this encounter  Medications   norethindrone -ethinyl estradiol  1/35 (ORTHO-NOVUM) tablet    Sig: Take 1 tablet by mouth daily.    Dispense:  84 tablet    Refill:  4   Follow-up: Return in about 1 year (around 08/11/2024) for annual exam with pap.  Izell Marsh, MD 08/12/2023 4:10 PM

## 2023-08-12 NOTE — Progress Notes (Signed)
 Pt has questions about vaginal dryness.  Pt has been taking some supplements, with no much relief.

## 2023-08-14 LAB — HEPATITIS C ANTIBODY: Hep C Virus Ab: NONREACTIVE

## 2023-08-14 LAB — HIV ANTIBODY (ROUTINE TESTING W REFLEX): HIV Screen 4th Generation wRfx: NONREACTIVE

## 2023-08-14 LAB — HEPATITIS B SURFACE ANTIGEN: Hepatitis B Surface Ag: NEGATIVE

## 2023-08-14 LAB — RPR: RPR Ser Ql: NONREACTIVE

## 2023-08-16 ENCOUNTER — Encounter: Payer: Self-pay | Admitting: Obstetrics and Gynecology

## 2023-08-16 LAB — CERVICOVAGINAL ANCILLARY ONLY
Chlamydia: NEGATIVE
Comment: NEGATIVE
Comment: NEGATIVE
Comment: NORMAL
Neisseria Gonorrhea: NEGATIVE
Trichomonas: NEGATIVE

## 2023-08-20 MED ORDER — JUNEL FE 24 1-20 MG-MCG(24) PO TABS
1.0000 | ORAL_TABLET | Freq: Every day | ORAL | 3 refills | Status: DC
Start: 2023-08-20 — End: 2024-03-02

## 2023-08-20 NOTE — Progress Notes (Signed)
 Called patient 5/2 - she was already on 35mcg dose of estrogen. Discussed alternatives like Nuvaring, Nexplanon or IUD which have not been shown to decrease vaginal lubrication. She really does like the pill so would rather not come off it, but notes she's been dealing w/ the vaginal dryness more or less since starting the pill. Looked back and she has always been on a 30-35mcg dose of estrogen. Offered to try a lower dose to see if this suppresses her endogenous estradiol /testosterone/SHBG levels less. We also discussed that she may have more breakthrough bleeding with the change in OCP. Will give it 2-3 cycles to see if there is any improvement.   Rx sent for Junel 24 (1/20)  Loralyn Rochester, MD Obstetrician & Gynecologist, [facility], [facility]

## 2023-08-20 NOTE — Addendum Note (Signed)
 Addended by: Loralyn Rochester on: 08/20/2023 06:23 PM   Modules accepted: Orders

## 2023-08-25 ENCOUNTER — Telehealth: Admitting: Family Medicine

## 2023-08-25 DIAGNOSIS — L731 Pseudofolliculitis barbae: Secondary | ICD-10-CM

## 2023-08-25 DIAGNOSIS — R21 Rash and other nonspecific skin eruption: Secondary | ICD-10-CM

## 2023-08-25 NOTE — Progress Notes (Signed)
  Thank you for the details you included in the comment boxes. Those details are very helpful in determining the best course of treatment for you and help us  to provide the best care.Because you have several areas of concern and or possible need for different treatments, we recommend that you schedule a Virtual Urgent Care video visit in order for the provider to better assess what is going on.  The provider will be able to give you a more accurate diagnosis and treatment plan if we can more freely discuss your symptoms and with the addition of a virtual examination.   If you change your visit to a video visit, we will bill your insurance (similar to an office visit) and you will not be charged for this e-Visit. You will be able to stay at home and speak with the first available [facility] advanced practice provider. The link to do a video visit is in the drop down Menu tab of your Welcome screen in MyChart.

## 2023-08-26 ENCOUNTER — Telehealth: Admitting: Physician Assistant

## 2023-08-26 DIAGNOSIS — L2082 Flexural eczema: Secondary | ICD-10-CM | POA: Diagnosis not present

## 2023-08-26 MED ORDER — TRIAMCINOLONE ACETONIDE 0.1 % EX CREA: 1.0000 | TOPICAL_CREAM | Freq: Two times a day (BID) | CUTANEOUS | 0 refills | Status: DC

## 2023-08-26 NOTE — Progress Notes (Signed)

## 2023-09-03 ENCOUNTER — Other Ambulatory Visit: Payer: Self-pay

## 2023-09-03 ENCOUNTER — Encounter (HOSPITAL_BASED_OUTPATIENT_CLINIC_OR_DEPARTMENT_OTHER): Payer: Self-pay | Admitting: Plastic Surgery

## 2023-09-06 ENCOUNTER — Telehealth: Admitting: Physician Assistant

## 2023-09-06 DIAGNOSIS — R21 Rash and other nonspecific skin eruption: Secondary | ICD-10-CM

## 2023-09-06 MED ORDER — BETAMETHASONE DIPROPIONATE 0.05 % EX CREA: TOPICAL_CREAM | Freq: Two times a day (BID) | CUTANEOUS | 0 refills | Status: DC

## 2023-09-06 NOTE — Progress Notes (Signed)
  Because you have failed a first-line treatment, I feel your condition warrants further evaluation and I recommend that you be seen in a face-to-face visit.   NOTE: There will be NO CHARGE for this E-Visit   If you are having a true medical emergency, please call 911.     For an urgent face to face visit, [place] has multiple urgent care centers for your convenience.  Click the link below for the full list of locations and hours, walk-in wait times, appointment scheduling options and driving directions:  Urgent Care - [place], [place], [place], [place], [place], [place]  [place]     Your MyChart E-visit questionnaire answers were reviewed by a board certified advanced clinical practitioner to complete your personal care plan based on your specific symptoms.    Thank you for using e-Visits.   I have spent 5 minutes in review of e-visit questionnaire, review and updating patient chart, medical decision making and response to patient.   Margaretann Loveless, PA-C

## 2023-09-06 NOTE — Addendum Note (Signed)
 Addended by: Angelia Kelp on: 09/06/2023 07:40 AM   Modules accepted: Orders, Level of Service

## 2023-09-06 NOTE — Progress Notes (Signed)

## 2023-09-07 ENCOUNTER — Telehealth: Admitting: Physician Assistant

## 2023-09-07 DIAGNOSIS — N61 Mastitis without abscess: Secondary | ICD-10-CM

## 2023-09-07 NOTE — Progress Notes (Signed)
  Because a culture of the area is needed to help direct treatment to prevent complications in this area, I feel your condition warrants further evaluation and I recommend that you be seen in a face-to-face visit.   NOTE: There will be NO CHARGE for this E-Visit   If you are having a true medical emergency, please call 911.     For an urgent face to face visit, [facility] has multiple urgent care centers for your convenience.  Click the link below for the full list of locations and hours, walk-in wait times, appointment scheduling options and driving directions:  Urgent Care - [place], [place], [place], [place], [place], [place]  [place]     Your MyChart E-visit questionnaire answers were reviewed by a board certified advanced clinical practitioner to complete your personal care plan based on your specific symptoms.    Thank you for using e-Visits.

## 2023-09-08 DIAGNOSIS — N61 Mastitis without abscess: Secondary | ICD-10-CM | POA: Diagnosis not present

## 2023-09-08 DIAGNOSIS — N906 Unspecified hypertrophy of vulva: Secondary | ICD-10-CM | POA: Diagnosis not present

## 2023-09-08 DIAGNOSIS — X58XXXA Exposure to other specified factors, initial encounter: Secondary | ICD-10-CM | POA: Diagnosis not present

## 2023-09-08 DIAGNOSIS — S21039A Puncture wound without foreign body of unspecified breast, initial encounter: Secondary | ICD-10-CM | POA: Diagnosis not present

## 2023-09-08 DIAGNOSIS — T887XXA Unspecified adverse effect of drug or medicament, initial encounter: Secondary | ICD-10-CM | POA: Diagnosis not present

## 2023-09-08 NOTE — H&P (Signed)
 Subjective Patient ID: Robin Garrison is a 23 y.o. female.     HPI   Returns for follow up discussion labial hypertrophy. Reports enlarged labia that gets swollen and irritated recurrent. Reports pain, need to adjust herself while sitting, frequent itching. States difficulty with exercise, hygiene, UTI's, sexual and physical activity. Unable to wear tight fitting jeans or clothing. Has tried ph balance soaps and vinegar baths to help reduce irritation. Reports struggling with this for the majority of her life but has worsened since childbirth.   PMH significant for UC. Reports in remission for 1-2 years   On Zepbound. Wt down 12 lb since time of consult visit.    Lives with 46 yo daughter. Friend would assist with post op care. 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  Skin:        itching  All other systems reviewed and are negative.     Objective Physical Exam  Cardiovascular: Normal rate, regular rhythm and normal heart sounds.    Pulmonary/Chest Effort normal and breath sounds normal.    Skin   Fitzpatrick 6    GU: bilateral labia minora hypertrophy   Assessment/Plan Labial hypertrophy     Symptomatic labial hypertrophy. Discussed labiaplasty with excision of redundant tissue. Discussed scars, sutures, OP surgery, post procedure limitations including no sex, no swimming, no exercise or sports for few weeks time. Reviewed swelling expected pain drainage. Ideally would schedule when not on period as this will make hygiene difficult, no tampon use while healing. Reviewed asymmetry present at this time and post surgery there may still be asymmetry.   Additional risks including infection, wound healing problems, need for additional procedures, unacceptable cosmetic result, damage to adjacent structures, blood clots in legs or lungs reviewed.    Patient has held Zepbound. Rx for tramadol  given.  Alger Infield, MD [facility] Plastic & Reconstructive Surgery  Office/ physician access line after hours [phone]

## 2023-09-09 NOTE — Progress Notes (Signed)

## 2023-09-10 ENCOUNTER — Ambulatory Visit (HOSPITAL_BASED_OUTPATIENT_CLINIC_OR_DEPARTMENT_OTHER): Admitting: Anesthesiology

## 2023-09-10 ENCOUNTER — Encounter (HOSPITAL_BASED_OUTPATIENT_CLINIC_OR_DEPARTMENT_OTHER)
Admission: RE | Disposition: A | Discharge status: Home / Self Care | Payer: Self-pay | Source: Home / Self Care | Attending: Plastic Surgery

## 2023-09-10 ENCOUNTER — Other Ambulatory Visit: Payer: Self-pay

## 2023-09-10 ENCOUNTER — Ambulatory Visit (HOSPITAL_BASED_OUTPATIENT_CLINIC_OR_DEPARTMENT_OTHER)
Admission: RE | Admit: 2023-09-10 | Discharge: 2023-09-10 | Disposition: A | Discharge status: Home / Self Care | Attending: Plastic Surgery | Admitting: Plastic Surgery

## 2023-09-10 ENCOUNTER — Encounter (HOSPITAL_BASED_OUTPATIENT_CLINIC_OR_DEPARTMENT_OTHER): Payer: Self-pay | Admitting: Plastic Surgery

## 2023-09-10 DIAGNOSIS — K5190 Ulcerative colitis, unspecified, without complications: Secondary | ICD-10-CM | POA: Insufficient documentation

## 2023-09-10 DIAGNOSIS — N906 Unspecified hypertrophy of vulva: Secondary | ICD-10-CM | POA: Diagnosis not present

## 2023-09-10 DIAGNOSIS — Z01818 Encounter for other preprocedural examination: Secondary | ICD-10-CM

## 2023-09-10 DIAGNOSIS — N9069 Other specified hypertrophy of vulva: Secondary | ICD-10-CM | POA: Diagnosis not present

## 2023-09-10 HISTORY — PX: LABIOPLASTY: SHX1900

## 2023-09-10 LAB — POCT PREGNANCY, URINE: Preg Test, Ur: NEGATIVE

## 2023-09-10 SURGERY — LABIAPLASTY, VULVA
Anesthesia: General | Site: Vulva | Laterality: Bilateral

## 2023-09-10 MED ORDER — LIDOCAINE 2% (20 MG/ML) 5 ML SYRINGE
INTRAMUSCULAR | Status: AC
  Filled 2023-09-10: qty 5

## 2023-09-10 MED ORDER — BUPIVACAINE-EPINEPHRINE 0.25% -1:200000 IJ SOLN
INTRAMUSCULAR | Status: DC | PRN
  Administered 2023-09-10: 16 mL

## 2023-09-10 MED ORDER — BACITRACIN 500 UNIT/GM EX OINT
TOPICAL_OINTMENT | CUTANEOUS | Status: DC | PRN
Start: 2023-09-10 — End: 2023-09-10
  Administered 2023-09-10: 1 via TOPICAL

## 2023-09-10 MED ORDER — MIDAZOLAM HCL 5 MG/5ML IJ SOLN
INTRAMUSCULAR | Status: DC | PRN
  Administered 2023-09-10: 2 mg via INTRAVENOUS

## 2023-09-10 MED ORDER — CEFAZOLIN SODIUM-DEXTROSE 2-4 GM/100ML-% IV SOLN
INTRAVENOUS | Status: AC
  Filled 2023-09-10: qty 100

## 2023-09-10 MED ORDER — OXYCODONE HCL 5 MG PO TABS: 5.0000 mg | ORAL_TABLET | Freq: Once | ORAL | Status: DC | PRN

## 2023-09-10 MED ORDER — PROPOFOL 10 MG/ML IV BOLUS
INTRAVENOUS | Status: AC
  Filled 2023-09-10: qty 20

## 2023-09-10 MED ORDER — EPHEDRINE SULFATE (PRESSORS) 50 MG/ML IJ SOLN
INTRAMUSCULAR | Status: DC | PRN
  Administered 2023-09-10 (×3): 5 mg via INTRAVENOUS

## 2023-09-10 MED ORDER — LIDOCAINE 2% (20 MG/ML) 5 ML SYRINGE
INTRAMUSCULAR | Status: DC | PRN
  Administered 2023-09-10: 40 mg via INTRAVENOUS

## 2023-09-10 MED ORDER — OXYCODONE HCL 5 MG/5ML PO SOLN: 5.0000 mg | Freq: Once | ORAL | Status: DC | PRN

## 2023-09-10 MED ORDER — MIDAZOLAM HCL 2 MG/2ML IJ SOLN
INTRAMUSCULAR | Status: AC
  Filled 2023-09-10: qty 2

## 2023-09-10 MED ORDER — FENTANYL CITRATE (PF) 100 MCG/2ML IJ SOLN
INTRAMUSCULAR | Status: DC | PRN
  Administered 2023-09-10: 25 ug via INTRAVENOUS
  Administered 2023-09-10: 50 ug via INTRAVENOUS

## 2023-09-10 MED ORDER — PROPOFOL 10 MG/ML IV BOLUS
INTRAVENOUS | Status: DC | PRN
  Administered 2023-09-10: 200 mg via INTRAVENOUS

## 2023-09-10 MED ORDER — DEXMEDETOMIDINE HCL IN NACL 80 MCG/20ML IV SOLN
INTRAVENOUS | Status: AC
  Filled 2023-09-10: qty 20

## 2023-09-10 MED ORDER — BACITRACIN ZINC 500 UNIT/GM EX OINT
TOPICAL_OINTMENT | CUTANEOUS | Status: AC
  Filled 2023-09-10: qty 28.35

## 2023-09-10 MED ORDER — GABAPENTIN 300 MG PO CAPS
ORAL_CAPSULE | ORAL | Status: AC
Start: 2023-09-10 — End: ?
  Filled 2023-09-10: qty 1

## 2023-09-10 MED ORDER — CEFAZOLIN SODIUM-DEXTROSE 2-4 GM/100ML-% IV SOLN
2.0000 g | INTRAVENOUS | Status: AC
  Administered 2023-09-10: 2 g via INTRAVENOUS

## 2023-09-10 MED ORDER — CELECOXIB 200 MG PO CAPS
ORAL_CAPSULE | ORAL | Status: AC
  Filled 2023-09-10: qty 1

## 2023-09-10 MED ORDER — ACETAMINOPHEN 500 MG PO TABS
ORAL_TABLET | ORAL | Status: AC
  Filled 2023-09-10: qty 2

## 2023-09-10 MED ORDER — ONDANSETRON HCL 4 MG/2ML IJ SOLN
INTRAMUSCULAR | Status: AC
  Filled 2023-09-10: qty 2

## 2023-09-10 MED ORDER — HYDROMORPHONE HCL 1 MG/ML IJ SOLN
INTRAMUSCULAR | Status: AC
  Filled 2023-09-10: qty 0.5

## 2023-09-10 MED ORDER — GABAPENTIN 300 MG PO CAPS
300.0000 mg | ORAL_CAPSULE | ORAL | Status: AC
Start: 2023-09-10 — End: 2023-09-10
  Administered 2023-09-10: 300 mg via ORAL

## 2023-09-10 MED ORDER — AMISULPRIDE (ANTIEMETIC) 5 MG/2ML IV SOLN: 10.0000 mg | Freq: Once | INTRAVENOUS | Status: DC | PRN

## 2023-09-10 MED ORDER — FENTANYL CITRATE (PF) 100 MCG/2ML IJ SOLN
INTRAMUSCULAR | Status: AC
  Filled 2023-09-10: qty 2

## 2023-09-10 MED ORDER — CELECOXIB 200 MG PO CAPS: 200.0000 mg | ORAL_CAPSULE | ORAL | Status: DC

## 2023-09-10 MED ORDER — DEXAMETHASONE SODIUM PHOSPHATE 10 MG/ML IJ SOLN
INTRAMUSCULAR | Status: DC | PRN
  Administered 2023-09-10: 10 mg via INTRAVENOUS

## 2023-09-10 MED ORDER — MEPERIDINE HCL 25 MG/ML IJ SOLN: 6.2500 mg | INTRAMUSCULAR | Status: DC | PRN

## 2023-09-10 MED ORDER — HYDROMORPHONE HCL 1 MG/ML IJ SOLN
0.2500 mg | INTRAMUSCULAR | Status: DC | PRN
  Administered 2023-09-10: 0.5 mg via INTRAVENOUS

## 2023-09-10 MED ORDER — SODIUM CHLORIDE 0.9 % IV SOLN: 12.5000 mg | INTRAVENOUS | Status: DC | PRN

## 2023-09-10 MED ORDER — DEXAMETHASONE SODIUM PHOSPHATE 10 MG/ML IJ SOLN
INTRAMUSCULAR | Status: AC
  Filled 2023-09-10: qty 1

## 2023-09-10 MED ORDER — EPHEDRINE 5 MG/ML INJ
INTRAVENOUS | Status: AC
  Filled 2023-09-10: qty 5

## 2023-09-10 MED ORDER — DEXMEDETOMIDINE HCL IN NACL 80 MCG/20ML IV SOLN
INTRAVENOUS | Status: DC | PRN
  Administered 2023-09-10: 12 ug via INTRAVENOUS

## 2023-09-10 MED ORDER — ACETAMINOPHEN 500 MG PO TABS
1000.0000 mg | ORAL_TABLET | ORAL | Status: AC
  Administered 2023-09-10: 1000 mg via ORAL

## 2023-09-10 MED ORDER — LACTATED RINGERS IV SOLN: INTRAVENOUS | Status: DC

## 2023-09-10 MED ORDER — FENTANYL CITRATE (PF) 100 MCG/2ML IJ SOLN
INTRAMUSCULAR | Status: AC
Start: 2023-09-10 — End: ?
  Filled 2023-09-10: qty 2

## 2023-09-10 MED ORDER — ONDANSETRON HCL 4 MG/2ML IJ SOLN
INTRAMUSCULAR | Status: DC | PRN
  Administered 2023-09-10: 4 mg via INTRAVENOUS

## 2023-09-10 SURGICAL SUPPLY — 41 items
BLADE CLIPPER SURG (BLADE) IMPLANT
BLADE SURG 11 STRL SS (BLADE) IMPLANT
BLADE SURG 15 STRL LF DISP TIS (BLADE) ×1 IMPLANT
BRIEF MESH DISP 2XL (UNDERPADS AND DIAPERS) IMPLANT
CANISTER SUCT 1200ML W/VALVE (MISCELLANEOUS) IMPLANT
COVER MAYO STAND STRL (DRAPES) ×2 IMPLANT
DERMABOND ADVANCED .7 DNX12 (GAUZE/BANDAGES/DRESSINGS) IMPLANT
DRSG TELFA 3X8 NADH STRL (GAUZE/BANDAGES/DRESSINGS) IMPLANT
ELECT COATED BLADE 2.86 ST (ELECTRODE) IMPLANT
ELECT NDL BLADE 2-5/6 (NEEDLE) IMPLANT
ELECT NEEDLE BLADE 2-5/6 (NEEDLE) ×1 IMPLANT
ELECTRODE REM PT RTRN 9FT ADLT (ELECTROSURGICAL) ×1 IMPLANT
GAUZE SPONGE 4X4 12PLY STRL LF (GAUZE/BANDAGES/DRESSINGS) IMPLANT
GLOVE BIO SURGEON STRL SZ 6 (GLOVE) ×1 IMPLANT
GLOVE BIOGEL PI IND STRL 7.0 (GLOVE) IMPLANT
GLOVE BIOGEL PI IND STRL 7.5 (GLOVE) IMPLANT
GLOVE SURG SS PI 7.0 STRL IVOR (GLOVE) IMPLANT
GOWN STRL REUS W/ TWL LRG LVL3 (GOWN DISPOSABLE) ×2 IMPLANT
NDL PRECISIONGLIDE 27X1.5 (NEEDLE) ×1 IMPLANT
NEEDLE PRECISIONGLIDE 27X1.5 (NEEDLE) ×1 IMPLANT
NS IRRIG 1000ML POUR BTL (IV SOLUTION) IMPLANT
PACK BASIN DAY SURGERY FS (CUSTOM PROCEDURE TRAY) ×1 IMPLANT
PACK LITHOTOMY IV (CUSTOM PROCEDURE TRAY) ×1 IMPLANT
PAD OB MATERNITY 11 LF (PERSONAL CARE ITEMS) IMPLANT
PENCIL SMOKE EVACUATOR (MISCELLANEOUS) ×1 IMPLANT
SLEEVE SCD COMPRESS KNEE MED (STOCKING) IMPLANT
SPONGE T-LAP 18X18 ~~LOC~~+RFID (SPONGE) IMPLANT
SUCTION TUBE FRAZIER 10FR DISP (SUCTIONS) IMPLANT
SUT CHROMIC 4 0 P 3 18 (SUTURE) IMPLANT
SUT MNCRL AB 4-0 PS2 18 (SUTURE) IMPLANT
SUT MON AB 5-0 P3 18 (SUTURE) IMPLANT
SUT VIC AB 4-0 P-3 18XBRD (SUTURE) IMPLANT
SUT VIC AB 4-0 PS2 18 (SUTURE) IMPLANT
SUT VICRYL RAPIDE 4-0 (SUTURE) IMPLANT
SUT VICRYL RAPIDE 4/0 PS 2 (SUTURE) IMPLANT
SYR BULB EAR ULCER 3OZ GRN STR (SYRINGE) IMPLANT
SYR CONTROL 10ML LL (SYRINGE) IMPLANT
TOWEL GREEN STERILE FF (TOWEL DISPOSABLE) ×1 IMPLANT
TRAY DSU PREP LF (CUSTOM PROCEDURE TRAY) IMPLANT
TUBE CONNECTING 20X1/4 (TUBING) IMPLANT
UNDERPAD 30X36 HEAVY ABSORB (UNDERPADS AND DIAPERS) ×1 IMPLANT

## 2023-09-10 NOTE — Transfer of Care (Signed)
 Immediate Anesthesia Transfer of Care Note  Patient: Robin Garrison  Procedure(s) Performed: BILATERAL LABIAPLASTY AND REMOVAL OF BILATERAL NIPPLE PIERCINGS (Bilateral: Vulva)  Patient Location: PACU  Anesthesia Type:General  Level of Consciousness: awake, alert , and oriented  Airway & Oxygen Therapy: Patient Spontanous Breathing and Patient connected to face mask oxygen  Post-op Assessment: Report given to RN and Post -op Vital signs reviewed and stable  Post vital signs: Reviewed and stable  Last Vitals:  Vitals Value Taken Time  BP 123/83 09/10/23 1233  Temp    Pulse 97 09/10/23 1234  Resp 12 09/10/23 1234  SpO2 100 % 09/10/23 1234  Vitals shown include unfiled device data.  Last Pain:  Vitals:   09/10/23 1027  TempSrc: Oral  PainSc: 0-No pain      Patients Stated Pain Goal: 4 (09/10/23 1027)  Complications: No notable events documented.

## 2023-09-10 NOTE — Discharge Instructions (Signed)
 No Tylenol  until 4:48pm, if needed.

## 2023-09-10 NOTE — Anesthesia Procedure Notes (Signed)
 Procedure Name: LMA Insertion Date/Time: 09/10/2023 11:14 AM  Performed by: Leverne Reading, CRNAPre-anesthesia Checklist: Patient identified, Emergency Drugs available, Suction available and Patient being monitored Patient Re-evaluated:Patient Re-evaluated prior to induction Oxygen Delivery Method: Circle system utilized Preoxygenation: Pre-oxygenation with 100% oxygen Induction Type: IV induction LMA: LMA inserted LMA Size: 4.0 Number of attempts: 1 Placement Confirmation: positive ETCO2 Tube secured with: Tape Dental Injury: Teeth and Oropharynx as per pre-operative assessment

## 2023-09-10 NOTE — Interval H&P Note (Addendum)
 History and Physical Interval Note:  09/10/2023 10:26 AM  Robin Garrison  has presented today for surgery, with the diagnosis of Labial hypertrophy.  The various methods of treatment have been discussed with the patient and family. After consideration of risks, benefits and other options for treatment, the patient has consented to  Procedure(s): LABIAPLASTY, VULVA (Bilateral) as a surgical intervention.  The patient's history has been reviewed, patient examined, no change in status, stable for surgery.  I have reviewed the patient's chart and labs.  Questions were answered to the patient's satisfaction.    Patient asks for removal bilateral nipple piercings while under anesthesia. She states they are metal and we recommend removal metal for procedure. She also was seen in Urgent Care for concern infection. There is on exam no drainage nor cellulitis. Plan to proceed with labiaplasty.  Robin Garrison Robin Garrison

## 2023-09-10 NOTE — Anesthesia Preprocedure Evaluation (Addendum)
 Anesthesia Evaluation  Patient identified by MRN, date of birth, ID band Patient awake    Reviewed: Allergy & Precautions, H&P , NPO status , Patient's Chart, lab work & pertinent test results  History of Anesthesia Complications Negative for: history of anesthetic complications  Airway Mallampati: II  TM Distance: >3 FB Neck ROM: Full    Dental no notable dental hx.    Pulmonary neg pulmonary ROS   Pulmonary exam normal        Cardiovascular negative cardio ROS Normal cardiovascular exam     Neuro/Psych negative neurological ROS  negative psych ROS   GI/Hepatic negative GI ROS, Neg liver ROS,,,Ulcerative colitis   Endo/Other  negative endocrine ROS    Renal/GU negative Renal ROS  negative genitourinary   Musculoskeletal negative musculoskeletal ROS (+)    Abdominal   Peds negative pediatric ROS (+)  Hematology negative hematology ROS (+)   Anesthesia Other Findings   Reproductive/Obstetrics negative OB ROS                             Anesthesia Physical Anesthesia Plan  ASA: 1  Anesthesia Plan: General   Post-op Pain Management:    Induction: Intravenous  PONV Risk Score and Plan: 3 and Treatment may vary due to age or medical condition, Ondansetron , Dexamethasone and Midazolam  Airway Management Planned: LMA  Additional Equipment:   Intra-op Plan:   Post-operative Plan: Extubation in OR  Informed Consent: I have reviewed the patients History and Physical, chart, labs and discussed the procedure including the risks, benefits and alternatives for the proposed anesthesia with the patient or authorized representative who has indicated his/her understanding and acceptance.       Plan Discussed with:   Anesthesia Plan Comments:         Anesthesia Quick Evaluation

## 2023-09-10 NOTE — Op Note (Signed)
 Operative Note   DATE OF OPERATION: 5.23.2025  LOCATION: [facility]-outpatient  SURGICAL DIVISION: Plastic Surgery  PREOPERATIVE DIAGNOSES:  Labial hypertrophy  POSTOPERATIVE DIAGNOSES:  same  PROCEDURE:  Bilateral labiaplasty  SURGEON: Alger Infield MD MBA  ASSISTANT: none  ANESTHESIA:  General.   EBL: 15 ml  COMPLICATIONS: None immediate.   INDICATIONS FOR PROCEDURE:  The patient, Robin Garrison, is a 23 y.o. female born on 28-Sep-2000, is here for treatment symptomatic labial hypertrophy.   FINDINGS: Bilateral labia minora hypertrophy  DESCRIPTION OF PROCEDURE:  The patient's operative site was marked with the patient in the preoperative area. The patient was taken to the operating room. SCDs were placed and IV antibiotics were given. Following induction, bilateral nipple piercing removed. The patient's operative site was prepped and draped in a sterile fashion. A time out was performed and all information was confirmed to be correct. Local anesthetic infiltrated within labia minora and surrounding labia majora. Clamp placed on middle left labia minora and distracted toward posterior introitus. Amount resection selected based on tension free closure to posterior vagina or introitus. Wedge shaped excision, approximately 90 degrees completed with knife full thickness. Hemostasis obtained. Anteriorly based flap inset with 4-0 vicryl in subcutaneous tissue. Mucosa closed with interrupted and running locking 4-0 Vicryl rapide. Over right labia minora, clamp placed and distracted toward posterior introitus. Amount resection selected based on tension free closure to posterior vagina. Wedge shaped excision, approximately 90 degrees completed with knife full thickness. Hemostasis obtained. Anteriorly based flap inset with 4-0 vicryl in subcutaneous tissue. Mucosa closed with interrupted and running locking 4-0 Vicryl rapide. Antibiotic ointment and dry dressing applied.    The  patient was allowed to wake from anesthesia, extubated and taken to the recovery room in satisfactory condition.   SPECIMENS: right and left labia minora  DRAINS: none  Alger Infield, MD [facility] Plastic & Reconstructive Surgery  Office/ physician access line after hours [phone]

## 2023-09-11 NOTE — Anesthesia Postprocedure Evaluation (Signed)
 Anesthesia Post Note  Patient: Robin Garrison  Procedure(s) Performed: BILATERAL LABIAPLASTY (Bilateral: Vulva)     Patient location during evaluation: PACU Anesthesia Type: General Level of consciousness: awake and alert Pain management: pain level controlled Vital Signs Assessment: post-procedure vital signs reviewed and stable Respiratory status: spontaneous breathing, nonlabored ventilation and respiratory function stable Cardiovascular status: blood pressure returned to baseline and stable Postop Assessment: no apparent nausea or vomiting Anesthetic complications: no   No notable events documented.  Last Vitals:  Vitals:   09/10/23 1300 09/10/23 1315  BP: 121/81 120/80  Pulse: 90 92  Resp: 18 15  Temp:  36.7 C  SpO2: 100% 100%    Last Pain:  Vitals:   09/10/23 1315  TempSrc:   PainSc: 1                  Kesley Gaffey

## 2023-09-12 ENCOUNTER — Encounter (HOSPITAL_BASED_OUTPATIENT_CLINIC_OR_DEPARTMENT_OTHER): Payer: Self-pay | Admitting: Plastic Surgery

## 2023-09-14 LAB — SURGICAL PATHOLOGY

## 2023-09-16 DIAGNOSIS — N906 Unspecified hypertrophy of vulva: Secondary | ICD-10-CM | POA: Diagnosis not present

## 2023-09-23 DIAGNOSIS — N906 Unspecified hypertrophy of vulva: Secondary | ICD-10-CM | POA: Diagnosis not present

## 2023-09-26 DIAGNOSIS — M549 Dorsalgia, unspecified: Secondary | ICD-10-CM | POA: Diagnosis not present

## 2023-09-26 DIAGNOSIS — S134XXA Sprain of ligaments of cervical spine, initial encounter: Secondary | ICD-10-CM | POA: Diagnosis not present

## 2023-10-03 ENCOUNTER — Telehealth: Admitting: Physician Assistant

## 2023-10-03 DIAGNOSIS — Z760 Encounter for issue of repeat prescription: Secondary | ICD-10-CM

## 2023-10-03 NOTE — Progress Notes (Signed)
  Because we do not prescribe birth control through digital urgent care visits, I feel your condition warrants further evaluation and I recommend that you be seen in a face-to-face visit. I would first recommend reaching out to your PCP or Gynecologist for this. If you do not have either of these regular providers, we recommend being seen at in-person urgent care for assessment.    NOTE: There will be NO CHARGE for this E-Visit   If you are having a true medical emergency, please call 911.     For an urgent face to face visit, [facility] has multiple urgent care centers for your convenience.  Click the link below for the full list of locations and hours, walk-in wait times, appointment scheduling options and driving directions:  Urgent Care - [place], [place], [place], [place], [place], [place]       Your MyChart E-visit questionnaire answers were reviewed by a board certified advanced clinical practitioner to complete your personal care plan based on your specific symptoms.    Thank you for using e-Visits.

## 2023-10-05 ENCOUNTER — Telehealth: Admitting: Physician Assistant

## 2023-10-05 DIAGNOSIS — L700 Acne vulgaris: Secondary | ICD-10-CM

## 2023-10-05 MED ORDER — CLINDAMYCIN PHOS-BENZOYL PEROX 1.2-5 % EX GEL: CUTANEOUS | 0 refills | Status: DC

## 2023-10-05 NOTE — Progress Notes (Signed)
E-Visit for Acne  We are sorry that you are experiencing this issue.  Here is how we plan to help!  Based on what you shared with me it looks like you have uncomplicated acne.  Acne is a disorder of the hair follicles and oil glands (sebaceous glands). The sebaceous glands secrete oils to keep the skin moist.  When the glands get clogged, it can lead to pimples or cysts.  These cysts may become infected and leave scars. Acne is very common and normally occurs at puberty.  Acne is also inherited.  Your personal care plan consists of the following recommendations:  I recommend that you use a daily cleanser  You may try a topical exfoliator and salicylic acid scrub.  These scrubs have coarse particles that clear your pores but may also irritate your skin.  I have prescribed a topical gel with an antibiotic:  Clindamycin-benzoyl peroxide gel.  This gel should be applied to the affected areas twice a day.  Be sure to read the package insert to understand potential side effects.   If excessive dryness or peeling occurs, reduce dose frequency or concentration of the topical scrubs.  If excessive stinging or burning occurs, remove the topical gel with mild soap and water and resume at a lower dose the next day.  Remember oral antibiotics and topical acne treatments may increase your sensitivity to the sun!  HOME CARE: Do not squeeze pimples because that can often lead to infections, worse acne, and scars. Use a moisturizer that contains retinoid or fruit acids that may inhibit the development of new acne lesions. Although there is not a clear link that foods can cause acne, doctors do believe that too many sweets predispose you to skin problems.  GET HELP RIGHT AWAY IF: If your acne gets worse or is not better within 10 days. If you become depressed. If you become pregnant, discontinue medications and call your OB/GYN.  MAKE SURE YOU: Understand these instructions. Will watch your  condition. Will get help right away if you are not doing well or get worse.  Thank you for choosing an e-visit.  Your e-visit answers were reviewed by a board certified advanced clinical practitioner to complete your personal care plan. Depending upon the condition, your plan could have included both over the counter or prescription medications.  Please review your pharmacy choice. Make sure the pharmacy is open so you can pick up prescription now. If there is a problem, you may contact your provider through [employer] and have the prescription routed to another pharmacy.  Your safety is important to [place]. If you have drug allergies check your prescription carefully.   For the next 24 hours you can use MyChart to ask questions about today's visit, request a non-urgent call back, or ask for a work or school excuse. You will get an email in the next two days asking about your experience. I hope that your e-visit has been valuable and will speed your recovery.  I have spent 5 minutes in review of e-visit questionnaire, review and updating patient chart, medical decision making and response to patient.   Margaretann Loveless, PA-C

## 2023-10-07 DIAGNOSIS — N906 Unspecified hypertrophy of vulva: Secondary | ICD-10-CM | POA: Diagnosis not present

## 2023-10-11 ENCOUNTER — Encounter (HOSPITAL_BASED_OUTPATIENT_CLINIC_OR_DEPARTMENT_OTHER): Payer: Self-pay | Admitting: Plastic Surgery

## 2023-10-11 ENCOUNTER — Other Ambulatory Visit: Payer: Self-pay

## 2023-10-11 DIAGNOSIS — N906 Unspecified hypertrophy of vulva: Secondary | ICD-10-CM | POA: Diagnosis not present

## 2023-10-11 NOTE — H&P (Signed)
 Subjective Patient ID: Robin Garrison is a 23 y.o. female.     HPI   4.5 w post op. 4 d post revision/re inset right labia minora following partial dehiscence incision. Called this am to report pain wound. Reports dehiscence of recent repair the day following in office procedure during evening. Went to work since that time.    PMH significant for UC. Reports in remission for 1-2 years   On Zepbound- she has held since surgery   Lives with daughter.  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    Pulmonary/Chest Effort normal and breath sounds normal.    GU: left labia minora intact scar maturing Over right, recent revision has dehisced, no active bleeding   Assessment/Plan Labial hypertrophy s/p bilateral labiaplasty   Plan repair right labia minora in OR under sedation. Counseled no evidence infection and wound will heal on own as it did with prior dehiscence. Counseled given inflamed nature of tissue given recent surgery, additional procedure, there is risk this will recur.    Earlis Ranks, MD [facility] Plastic & Reconstructive Surgery  Office/ physician access line after hours [phone]

## 2023-10-15 ENCOUNTER — Ambulatory Visit (HOSPITAL_BASED_OUTPATIENT_CLINIC_OR_DEPARTMENT_OTHER)
Admission: RE | Admit: 2023-10-15 | Discharge: 2023-10-15 | Disposition: A | Discharge status: Home / Self Care | Attending: Plastic Surgery | Admitting: Plastic Surgery

## 2023-10-15 ENCOUNTER — Encounter (HOSPITAL_BASED_OUTPATIENT_CLINIC_OR_DEPARTMENT_OTHER): Payer: Self-pay | Admitting: Plastic Surgery

## 2023-10-15 ENCOUNTER — Ambulatory Visit (HOSPITAL_BASED_OUTPATIENT_CLINIC_OR_DEPARTMENT_OTHER): Admitting: Anesthesiology

## 2023-10-15 ENCOUNTER — Encounter (HOSPITAL_BASED_OUTPATIENT_CLINIC_OR_DEPARTMENT_OTHER)
Admission: RE | Disposition: A | Discharge status: Home / Self Care | Payer: Self-pay | Source: Home / Self Care | Attending: Plastic Surgery

## 2023-10-15 ENCOUNTER — Other Ambulatory Visit: Payer: Self-pay

## 2023-10-15 DIAGNOSIS — Z01818 Encounter for other preprocedural examination: Secondary | ICD-10-CM

## 2023-10-15 DIAGNOSIS — T8131XA Disruption of external operation (surgical) wound, not elsewhere classified, initial encounter: Secondary | ICD-10-CM | POA: Insufficient documentation

## 2023-10-15 DIAGNOSIS — Z8711 Personal history of peptic ulcer disease: Secondary | ICD-10-CM | POA: Insufficient documentation

## 2023-10-15 DIAGNOSIS — Y838 Other surgical procedures as the cause of abnormal reaction of the patient, or of later complication, without mention of misadventure at the time of the procedure: Secondary | ICD-10-CM | POA: Diagnosis not present

## 2023-10-15 HISTORY — PX: DEBRIDEMENT AND CLOSURE WOUND: SHX5614

## 2023-10-15 LAB — POCT PREGNANCY, URINE: Preg Test, Ur: NEGATIVE

## 2023-10-15 SURGERY — DEBRIDEMENT, WOUND, WITH CLOSURE
Anesthesia: General | Site: Vagina | Laterality: Right

## 2023-10-15 MED ORDER — CEFAZOLIN SODIUM-DEXTROSE 2-4 GM/100ML-% IV SOLN
2.0000 g | INTRAVENOUS | Status: AC
  Administered 2023-10-15: 2 g via INTRAVENOUS

## 2023-10-15 MED ORDER — PROPOFOL 10 MG/ML IV BOLUS
INTRAVENOUS | Status: DC | PRN
  Administered 2023-10-15: 200 mg via INTRAVENOUS

## 2023-10-15 MED ORDER — MIDAZOLAM HCL 2 MG/2ML IJ SOLN
INTRAMUSCULAR | Status: AC
Start: 2023-10-15 — End: 2023-10-15
  Filled 2023-10-15: qty 2

## 2023-10-15 MED ORDER — BUPIVACAINE-EPINEPHRINE 0.25% -1:200000 IJ SOLN
INTRAMUSCULAR | Status: DC | PRN
  Administered 2023-10-15: 10 mL

## 2023-10-15 MED ORDER — ONDANSETRON HCL 4 MG/2ML IJ SOLN
INTRAMUSCULAR | Status: DC | PRN
Start: 2023-10-15 — End: 2023-10-15
  Administered 2023-10-15: 4 mg via INTRAVENOUS

## 2023-10-15 MED ORDER — FENTANYL CITRATE (PF) 100 MCG/2ML IJ SOLN
INTRAMUSCULAR | Status: AC
  Filled 2023-10-15: qty 2

## 2023-10-15 MED ORDER — ACETAMINOPHEN 500 MG PO TABS
ORAL_TABLET | ORAL | Status: AC
Start: 2023-10-15 — End: 2023-10-15
  Filled 2023-10-15: qty 2

## 2023-10-15 MED ORDER — DEXAMETHASONE SODIUM PHOSPHATE 4 MG/ML IJ SOLN
INTRAMUSCULAR | Status: DC | PRN
  Administered 2023-10-15: 5 mg via INTRAVENOUS

## 2023-10-15 MED ORDER — BACITRACIN-NEOMYCIN-POLYMYXIN OINTMENT TUBE
TOPICAL_OINTMENT | CUTANEOUS | Status: AC
  Filled 2023-10-15: qty 14.17

## 2023-10-15 MED ORDER — LIDOCAINE-EPINEPHRINE 1 %-1:100000 IJ SOLN
INTRAMUSCULAR | Status: AC
  Filled 2023-10-15: qty 1

## 2023-10-15 MED ORDER — LACTATED RINGERS IV SOLN: INTRAVENOUS | Status: DC

## 2023-10-15 MED ORDER — LIDOCAINE HCL (CARDIAC) PF 100 MG/5ML IV SOSY
PREFILLED_SYRINGE | INTRAVENOUS | Status: DC | PRN
  Administered 2023-10-15: 60 mg via INTRAVENOUS

## 2023-10-15 MED ORDER — HYDROMORPHONE HCL 1 MG/ML IJ SOLN
0.2500 mg | INTRAMUSCULAR | Status: DC | PRN
  Administered 2023-10-15 (×2): 0.5 mg via INTRAVENOUS

## 2023-10-15 MED ORDER — HYDROMORPHONE HCL 1 MG/ML IJ SOLN
INTRAMUSCULAR | Status: AC
  Filled 2023-10-15: qty 0.5

## 2023-10-15 MED ORDER — FENTANYL CITRATE (PF) 100 MCG/2ML IJ SOLN
INTRAMUSCULAR | Status: DC | PRN
  Administered 2023-10-15: 50 ug via INTRAVENOUS
  Administered 2023-10-15: 100 ug via INTRAVENOUS

## 2023-10-15 MED ORDER — DEXMEDETOMIDINE HCL IN NACL 80 MCG/20ML IV SOLN
INTRAVENOUS | Status: DC | PRN
  Administered 2023-10-15: 12 ug via INTRAVENOUS

## 2023-10-15 MED ORDER — DEXAMETHASONE SODIUM PHOSPHATE 10 MG/ML IJ SOLN
INTRAMUSCULAR | Status: AC
  Filled 2023-10-15: qty 3

## 2023-10-15 MED ORDER — ACETAMINOPHEN 500 MG PO TABS
1000.0000 mg | ORAL_TABLET | Freq: Once | ORAL | Status: AC
  Administered 2023-10-15: 1000 mg via ORAL

## 2023-10-15 MED ORDER — BACITRACIN ZINC 500 UNIT/GM EX OINT
TOPICAL_OINTMENT | CUTANEOUS | Status: AC
  Filled 2023-10-15: qty 0.9

## 2023-10-15 MED ORDER — CEFAZOLIN SODIUM-DEXTROSE 2-4 GM/100ML-% IV SOLN
INTRAVENOUS | Status: AC
  Filled 2023-10-15: qty 100

## 2023-10-15 MED ORDER — MIDAZOLAM HCL 5 MG/5ML IJ SOLN
INTRAMUSCULAR | Status: DC | PRN
  Administered 2023-10-15: 2 mg via INTRAVENOUS

## 2023-10-15 SURGICAL SUPPLY — 33 items
BENZOIN TINCTURE PRP APPL 2/3 (GAUZE/BANDAGES/DRESSINGS) IMPLANT
BLADE SURG 15 STRL LF DISP TIS (BLADE) ×1 IMPLANT
BRIEF MESH DISP 2XL (UNDERPADS AND DIAPERS) IMPLANT
CANISTER SUCT 1200ML W/VALVE (MISCELLANEOUS) IMPLANT
DERMABOND ADVANCED .7 DNX12 (GAUZE/BANDAGES/DRESSINGS) IMPLANT
ELECT COATED BLADE 2.86 ST (ELECTRODE) IMPLANT
ELECTRODE REM PT RTRN 9FT ADLT (ELECTROSURGICAL) IMPLANT
GAUZE SPONGE 4X4 12PLY STRL LF (GAUZE/BANDAGES/DRESSINGS) IMPLANT
GLOVE BIO SURGEON STRL SZ 6 (GLOVE) ×1 IMPLANT
GLOVE BIO SURGEON STRL SZ 6.5 (GLOVE) IMPLANT
GLOVE BIOGEL PI IND STRL 6.5 (GLOVE) IMPLANT
GOWN STRL REUS W/ TWL LRG LVL3 (GOWN DISPOSABLE) ×2 IMPLANT
NDL PRECISIONGLIDE 27X1.5 (NEEDLE) ×1 IMPLANT
NEEDLE PRECISIONGLIDE 27X1.5 (NEEDLE) ×1 IMPLANT
NS IRRIG 1000ML POUR BTL (IV SOLUTION) IMPLANT
PACK BASIN DAY SURGERY FS (CUSTOM PROCEDURE TRAY) ×1 IMPLANT
PACK LITHOTOMY IV (CUSTOM PROCEDURE TRAY) IMPLANT
PAD OB MATERNITY 11 LF (PERSONAL CARE ITEMS) IMPLANT
PENCIL SMOKE EVACUATOR (MISCELLANEOUS) IMPLANT
SLEEVE SCD COMPRESS KNEE MED (STOCKING) ×1 IMPLANT
SPONGE T-LAP 18X18 ~~LOC~~+RFID (SPONGE) IMPLANT
STRIP CLOSURE SKIN 1/2X4 (GAUZE/BANDAGES/DRESSINGS) IMPLANT
SUT ETHILON 4 0 PS 2 18 (SUTURE) IMPLANT
SUT MNCRL AB 4-0 PS2 18 (SUTURE) IMPLANT
SUT NYLON ETHILON 5-0 P-3 1X18 (SUTURE) IMPLANT
SUT VIC AB 4-0 P-3 18XBRD (SUTURE) IMPLANT
SUT VIC AB 4-0 PS2 18 (SUTURE) IMPLANT
SUT VICRYL RAPIDE 4-0 (SUTURE) IMPLANT
SUT VICRYL RAPIDE 4/0 PS 2 (SUTURE) IMPLANT
SYR CONTROL 10ML LL (SYRINGE) ×1 IMPLANT
TOWEL GREEN STERILE FF (TOWEL DISPOSABLE) ×1 IMPLANT
TRAY DSU PREP LF (CUSTOM PROCEDURE TRAY) ×1 IMPLANT
TUBE CONNECTING 20X1/4 (TUBING) IMPLANT

## 2023-10-15 NOTE — Transfer of Care (Signed)
 Immediate Anesthesia Transfer of Care Note  Patient: Robin Garrison  Procedure(s) Performed: RIGHT COMPLEX REPAIR OF LABIA MINORA (Right: Vagina )  Patient Location: PACU  Anesthesia Type:General  Level of Consciousness: awake, alert , oriented, drowsy, and patient cooperative  Airway & Oxygen Therapy: Patient Spontanous Breathing and Patient connected to face mask oxygen  Post-op Assessment: Report given to RN  Post vital signs: Reviewed  Last Vitals:  Vitals Value Taken Time  BP 106/80 10/15/23 13:32  Temp    Pulse 107 10/15/23 13:33  Resp 15 10/15/23 13:33  SpO2 100 % 10/15/23 13:33  Vitals shown include unfiled device data.  Last Pain:  Vitals:   10/15/23 1213  TempSrc: Temporal  PainSc: 0-No pain      Patients Stated Pain Goal: 3 (10/15/23 1213)  Complications: No notable events documented.

## 2023-10-15 NOTE — Anesthesia Procedure Notes (Signed)
 Procedure Name: LMA Insertion Date/Time: 10/15/2023 12:41 PM  Performed by: Emilio Rock BIRCH, CRNAPre-anesthesia Checklist: Patient identified, Emergency Drugs available, Suction available and Patient being monitored Patient Re-evaluated:Patient Re-evaluated prior to induction Oxygen Delivery Method: Circle System Utilized Preoxygenation: Pre-oxygenation with 100% oxygen Induction Type: IV induction Ventilation: Mask ventilation without difficulty LMA: LMA inserted LMA Size: 4.0 Number of attempts: 1 Airway Equipment and Method: bite block Placement Confirmation: positive ETCO2 Tube secured with: Tape Dental Injury: Teeth and Oropharynx as per pre-operative assessment

## 2023-10-15 NOTE — Interval H&P Note (Signed)
 History and Physical Interval Note:  10/15/2023 12:00 PM  Robin Garrison  has presented today for surgery, with the diagnosis of Open wound of genital labia, initial encounter Hypertrophy of labia minora.  The various methods of treatment have been discussed with the patient and family. After consideration of risks, benefits and other options for treatment, the patient has consented to  repair right labia minora as a surgical intervention.  The patient's history has been reviewed, patient examined, no change in status, stable for surgery.  I have reviewed the patient's chart and labs.  Questions were answered to the patient's satisfaction.     Robin Garrison

## 2023-10-15 NOTE — Anesthesia Preprocedure Evaluation (Addendum)
 Anesthesia Evaluation  Patient identified by MRN, date of birth, ID band Patient awake    Reviewed: Allergy & Precautions, H&P , NPO status , Patient's Chart, lab work & pertinent test results  Airway Mallampati: II  TM Distance: >3 FB Neck ROM: Full    Dental no notable dental hx. (+) Teeth Intact, Dental Advisory Given   Pulmonary neg pulmonary ROS   Pulmonary exam normal breath sounds clear to auscultation       Cardiovascular negative cardio ROS  Rhythm:Regular Rate:Normal     Neuro/Psych negative neurological ROS  negative psych ROS   GI/Hepatic Neg liver ROS, PUD,,,  Endo/Other  negative endocrine ROS    Renal/GU negative Renal ROS  negative genitourinary   Musculoskeletal   Abdominal   Peds  Hematology negative hematology ROS (+)   Anesthesia Other Findings   Reproductive/Obstetrics negative OB ROS                             Anesthesia Physical Anesthesia Plan  ASA: 2  Anesthesia Plan: General   Post-op Pain Management: Tylenol  PO (pre-op)*   Induction: Intravenous  PONV Risk Score and Plan: 4 or greater and Ondansetron , Dexamethasone  and Midazolam   Airway Management Planned: LMA  Additional Equipment:   Intra-op Plan:   Post-operative Plan: Extubation in OR  Informed Consent: I have reviewed the patients History and Physical, chart, labs and discussed the procedure including the risks, benefits and alternatives for the proposed anesthesia with the patient or authorized representative who has indicated his/her understanding and acceptance.     Dental advisory given  Plan Discussed with: CRNA  Anesthesia Plan Comments:        Anesthesia Quick Evaluation

## 2023-10-15 NOTE — Op Note (Signed)
 Operative Note   DATE OF OPERATION: 6.27.2025  LOCATION: [facility]-outpatient  SURGICAL DIVISION: Plastic Surgery  PREOPERATIVE DIAGNOSES:  1. Dehiscence surgical incision  POSTOPERATIVE DIAGNOSES:  same  PROCEDURE:  Complex repair right labial minora 3 cm  SURGEON: Earlis Ranks MD MBA  ASSISTANT: none  ANESTHESIA:  General.   EBL: 10 ml  COMPLICATIONS: None immediate.   INDICATIONS FOR PROCEDURE:  The patient, Robin Garrison, is a 23 y.o. female born on [birth year], is here for treatment dehiscence incision right labial following bilateral labiaplasty.   FINDINGS: Inset flap over right noted to be dehisced with complete epithelization wound.   DESCRIPTION OF PROCEDURE:  The patient's operative site was marked with the patient in the preoperative area. The patient was taken to the operating room. SCDs were placed and IV antibiotics were given. The patient's operative site was prepped and draped in a sterile fashion. A time out was performed and all information was confirmed to be correct. Local anesthetic infiltrated within labia minora and surrounding labia majora. Sharp excision margins of dehisced anteriorly based flap completed with scissors. Flap inset with interrupted 4-0 monocryl in submucosa to posterior introitus. Mucosa inset with interrupted 3-0 vicryl along vaginal margin. Over lateral margin skin closure completed with running locking 4-0 monocryl and additional interrupted 4-0 vicryl. Dry dressing applied.    The patient was allowed to wake from anesthesia, extubated and taken to the recovery room in satisfactory condition.   SPECIMENS: none  DRAINS: none  Earlis Ranks, MD [facility] Plastic & Reconstructive Surgery  Office/ physician access line after hours [phone]

## 2023-10-15 NOTE — Discharge Instructions (Addendum)
  Post Anesthesia Home Care Instructions  Activity: Get plenty of rest for the remainder of the day. A responsible individual must stay with you for 24 hours following the procedure.  For the next 24 hours, DO NOT: -Drive a car -Advertising copywriter -Drink alcoholic beverages -Take any medication unless instructed by your physician -Make any legal decisions or sign important papers.  Meals: Start with liquid foods such as gelatin or soup. Progress to regular foods as tolerated. Avoid greasy, spicy, heavy foods. If nausea and/or vomiting occur, drink only clear liquids until the nausea and/or vomiting subsides. Call your physician if vomiting continues.  Special Instructions/Symptoms: Your throat may feel dry or sore from the anesthesia or the breathing tube placed in your throat during surgery. If this causes discomfort, gargle with warm salt water. The discomfort should disappear within 24 hours.  Tylenol  is ok to take after 630pm

## 2023-10-15 NOTE — Anesthesia Postprocedure Evaluation (Signed)
 Anesthesia Post Note  Patient: Robin Garrison  Procedure(s) Performed: RIGHT COMPLEX REPAIR OF LABIA MINORA (Right: Vagina )     Patient location during evaluation: PACU Anesthesia Type: General Level of consciousness: awake and alert Pain management: pain level controlled Vital Signs Assessment: post-procedure vital signs reviewed and stable Respiratory status: spontaneous breathing, nonlabored ventilation and respiratory function stable Cardiovascular status: blood pressure returned to baseline and stable Postop Assessment: no apparent nausea or vomiting Anesthetic complications: no  No notable events documented.  Last Vitals:  Vitals:   10/15/23 1400 10/15/23 1425  BP: 97/69 119/81  Pulse: 88 85  Resp: 15 16  Temp:  (!) 36.2 C  SpO2: 97% 98%    Last Pain:  Vitals:   10/15/23 1425  TempSrc: Temporal  PainSc: 4                  Kerrick Miler,W. EDMOND

## 2023-10-16 ENCOUNTER — Encounter (HOSPITAL_BASED_OUTPATIENT_CLINIC_OR_DEPARTMENT_OTHER): Payer: Self-pay | Admitting: Plastic Surgery

## 2023-10-21 DIAGNOSIS — N906 Unspecified hypertrophy of vulva: Secondary | ICD-10-CM | POA: Diagnosis not present

## 2023-10-22 ENCOUNTER — Telehealth: Admitting: Physician Assistant

## 2023-10-22 DIAGNOSIS — R197 Diarrhea, unspecified: Secondary | ICD-10-CM

## 2023-10-22 NOTE — Progress Notes (Signed)
 Based on what you shared with me, I feel your condition warrants further evaluation as soon as possible at an Emergency department.    NOTE: There will be NO CHARGE for this eVisit   If you are having a true medical emergency please call 911.      Emergency Department-[facility]  Get Driving Directions  [phone]  [address]  [place], [place] [address]  Open 24/7/365      [facility] Emergency Department at [facility]  Get Driving Directions  [address]  [place], [place] [address]  Open 24/7/365    Emergency Department- [facility]  Get Driving Directions  [phone]  [address]  [place], [place] [address]  Open 24/7/365      [facility] at [facility]  Get Driving Directions  [phone]  [address]  [place], [place] [address]  Open 24/7/365    [facility]  Emergency Department- [facility]  Get Driving Directions  [phone]  [address]  [place], [place] [address]  Open 24/7/365    [place]  Emergency Department- [facility]  Get Driving Directions  [address]  [place], [place] [address]  Open 24/7/365    [facility]  Emergency Department- [facility]  Get Driving Directions  [phone]  [address]  [place], [place] [address]  Open 24/7/365    I have spent 5 minutes in review of e-visit questionnaire, review and updating patient chart, medical decision making and response to patient.   Roosvelt Mater, PA-C

## 2023-10-23 ENCOUNTER — Other Ambulatory Visit: Payer: Self-pay

## 2023-10-23 ENCOUNTER — Encounter (HOSPITAL_COMMUNITY): Payer: Self-pay

## 2023-10-23 ENCOUNTER — Emergency Department (HOSPITAL_COMMUNITY)
Admission: EM | Admit: 2023-10-23 | Discharge: 2023-10-23 | Disposition: A | Discharge status: Home / Self Care | Attending: Emergency Medicine | Admitting: Emergency Medicine

## 2023-10-23 DIAGNOSIS — K625 Hemorrhage of anus and rectum: Secondary | ICD-10-CM | POA: Diagnosis not present

## 2023-10-23 LAB — TYPE AND SCREEN
ABO/RH(D): B POS
Antibody Screen: NEGATIVE

## 2023-10-23 LAB — COMPREHENSIVE METABOLIC PANEL WITH GFR
ALT: 17 U/L (ref 0–44)
AST: 16 U/L (ref 15–41)
Albumin: 4 g/dL (ref 3.5–5.0)
Alkaline Phosphatase: 47 U/L (ref 38–126)
Anion gap: 9 (ref 5–15)
BUN: 7 mg/dL (ref 6–20)
CO2: 26 mmol/L (ref 22–32)
Calcium: 9.4 mg/dL (ref 8.9–10.3)
Chloride: 102 mmol/L (ref 98–111)
Creatinine, Ser: 0.73 mg/dL (ref 0.44–1.00)
GFR, Estimated: >60 mL/min (ref >60)
Glucose, Bld: 92 mg/dL (ref 70–99)
Potassium: 3.6 mmol/L (ref 3.5–5.1)
Sodium: 137 mmol/L (ref 135–145)
Total Bilirubin: 0.8 mg/dL (ref 0.0–1.2)
Total Protein: 7.9 g/dL (ref 6.5–8.1)

## 2023-10-23 LAB — CBC
HCT: 39.1 % (ref 36.0–46.0)
Hemoglobin: 12.4 g/dL (ref 12.0–15.0)
MCH: 30.5 pg (ref 26.0–34.0)
MCHC: 31.7 g/dL (ref 30.0–36.0)
MCV: 96.1 fL (ref 80.0–100.0)
Platelets: 478 K/uL — ABNORMAL HIGH (ref 150–400)
RBC: 4.07 MIL/uL (ref 3.87–5.11)
RDW: 12.2 % (ref 11.5–15.5)
WBC: 4.6 K/uL (ref 4.0–10.5)
nRBC: 0 % (ref 0.0–0.2)

## 2023-10-23 LAB — LIPASE, BLOOD: Lipase: 28 U/L (ref 11–51)

## 2023-10-23 LAB — HCG, SERUM, QUALITATIVE: Preg, Serum: NEGATIVE

## 2023-10-23 LAB — POC OCCULT BLOOD, ED: Fecal Occult Bld: POSITIVE — AB

## 2023-10-23 MED ORDER — PREDNISONE 20 MG PO TABS
40.0000 mg | ORAL_TABLET | Freq: Once | ORAL | Status: AC
  Administered 2023-10-23: 40 mg via ORAL
  Filled 2023-10-23: qty 2

## 2023-10-23 MED ORDER — PREDNISONE 5 MG PO TABS: ORAL_TABLET | ORAL | 0 refills | Status: AC

## 2023-10-23 NOTE — ED Provider Notes (Signed)
 [facility] EMERGENCY DEPARTMENT AT [facility] Provider Note   CSN: [id number] Arrival date & time: 10/23/23  8380     Patient presents with: Rectal Bleeding and Post-op Problem   Angola I Mcglade is a 23 y.o. female.    Rectal Bleeding Patient is a 23 year old female to the ED today with concerns for bright red rectal bleeding that has been intermittent for the last 6 months, more persistent for the last 3 months and having been accompanied with fatigue and increasing epigastric abdominal pain for the last month.  Medical history of ulcerative colitis, noting that she has not been seen by GI in approximately 1 to 2 years and is not on medication as she has been in remission.  States that symptoms feel very similar to her previous ulcerative colitis flare up.  Does not have GI follow-up until December.  Notably she did undergo labioplasty revision on 10/15/2023 and had follow-up with surgeon on 7/3, notably treated with Diflucan  at that time as well as noted to have wounds healing well.  Patient is concerned that the area is still not healing well despite surgeons opinion.  Denies fever, headache, vision changes, dysphagia, unilateral weakness, cough, hemoptysis, chest pain, shortness of breath, nausea, vomiting, hematemesis, melena, hematuria, dysuria, hematuria, vaginal bleeding, vaginal pain, vaginal itching, lower leg swelling.    Prior to Admission medications   Medication Sig Start Date End Date Taking? Authorizing Provider  predniSONE  (DELTASONE ) 5 MG tablet Take 8 tablets (40 mg total) by mouth daily for 14 days, THEN 7 tablets (35 mg total) daily for 7 days, THEN 6 tablets (30 mg total) daily for 7 days, THEN 5 tablets (25 mg total) daily for 7 days, THEN 4 tablets (20 mg total) daily for 7 days, THEN 3 tablets (15 mg total) daily for 7 days, THEN 2 tablets (10 mg total) daily for 7 days. 10/23/23 12/18/23 Yes Beola Terrall RAMAN, PA-C  betamethasone  dipropionate 0.05 % cream  Apply topically 2 (two) times daily. 09/06/23   Vivienne Delon HERO, PA-C  clindamycin  (CLEOCIN ) 150 MG capsule Take 150 mg by mouth 3 (three) times daily.    [provider]  Clindamycin -Benzoyl Per, Refr, gel Apply a thin amount to affected areas twice daily after cleansing face 10/05/23   Vivienne Delon HERO, PA-C  mesalamine  (LIALDA ) 1.2 g EC tablet Take 4 tablets (4.8 g total) by mouth daily with breakfast. 02/05/22 03/07/22  Nandigam, Kavitha V, MD  Norethindrone  Acetate-Ethinyl Estrad-FE (JUNEL  FE 24) 1-20 MG-MCG(24) tablet Take 1 tablet by mouth daily. 08/20/23   Erik Kieth BROCKS, MD  tretinoin  (RETIN-A ) 0.1 % cream Apply topically 2 (two) times a week. 04/15/23   Vivienne Delon HERO, PA-C  triamcinolone  cream (KENALOG ) 0.1 % Apply 1 Application topically 2 (two) times daily. Do not use on face 08/26/23   Vivienne Delon HERO, PA-C    Allergies: Ibuprofen , Lactose intolerance (gi), and Sulfa antibiotics    Review of Systems  Gastrointestinal:  Positive for anal bleeding and hematochezia.  All other systems reviewed and are negative.   Updated Vital Signs BP 105/73   Pulse 66   Temp 97.9 F (36.6 C) (Oral)   Resp 17   Ht 5' 2 (1.575 m)   Wt 65.8 kg   LMP 09/19/2023   SpO2 100%   BMI 26.52 kg/m   Physical Exam Vitals and nursing note reviewed. Exam conducted with a chaperone present.  Constitutional:      General: She is not in acute  distress.    Appearance: Normal appearance. She is not ill-appearing or diaphoretic.  HENT:     Head: Normocephalic and atraumatic.  Eyes:     General: No scleral icterus.       Right eye: No discharge.        Left eye: No discharge.     Extraocular Movements: Extraocular movements intact.     Conjunctiva/sclera: Conjunctivae normal.  Cardiovascular:     Rate and Rhythm: Normal rate and regular rhythm.     Pulses: Normal pulses.     Heart sounds: Normal heart sounds. No murmur heard.    No friction rub. No gallop.   Pulmonary:     Effort: Pulmonary effort is normal. No respiratory distress.     Breath sounds: Normal breath sounds.  Abdominal:     General: Abdomen is flat. There is no distension.     Palpations: Abdomen is soft.     Tenderness: There is abdominal tenderness (Mild epigastric tenderness noted to palpation). There is no right CVA tenderness, left CVA tenderness, guarding or rebound.  Genitourinary:    Exam position: Knee-chest position.     Labia:        Right: Tenderness present. No rash or lesion.        Left: No rash, tenderness or lesion.      Rectum: Normal. Guaiac result positive. No tenderness, anal fissure or external hemorrhoid. Normal anal tone.     Comments: Well-healing surgical site noted to right labia minora, no dehiscence noted. Musculoskeletal:     Cervical back: Normal range of motion and neck supple. No rigidity or tenderness.     Right lower leg: No edema.     Left lower leg: No edema.  Skin:    General: Skin is warm and dry.     Findings: No bruising or erythema.  Neurological:     General: No focal deficit present.     Mental Status: She is alert and oriented to person, place, and time. Mental status is at baseline.     Sensory: No sensory deficit.     Motor: No weakness.     Gait: Gait normal.  Psychiatric:        Mood and Affect: Mood normal.     (all labs ordered are listed, but only abnormal results are displayed) Labs Reviewed  CBC - Abnormal; Notable for the following components:      Result Value   Platelets 478 (*)    All other components within normal limits  POC OCCULT BLOOD, ED - Abnormal; Notable for the following components:   Fecal Occult Bld POSITIVE (*)    All other components within normal limits  COMPREHENSIVE METABOLIC PANEL WITH GFR  HCG, SERUM, QUALITATIVE  LIPASE, BLOOD  TYPE AND SCREEN    EKG: None  Radiology: No results found.  Procedures   Medications Ordered in the ED  predniSONE  (DELTASONE ) tablet 40 mg (has  no administration in time range)                                  Medical Decision Making Amount and/or Complexity of Data Reviewed Labs: ordered.  Risk Prescription drug management.   This patient is a 23 year old female who presents to the ED for concern of multiple complaints.  Most concern for bright red hematochezia noted to be getting worse over the last 1.5 months, accompanied with epigastric abdominal pain and increased  frequency, amount of hematochezia, fatigue.  Symptoms similar to her previous ulcerative colitis flare ups. Also concerned about wanting her labia surgical site evaluated, concerned that dehiscence may be present.  On physical exam, patient is in no acute distress, afebrile, alert and orient x 4, speaking in full sentences, nontachypneic, nontachycardic.  Notably does have mild central umbilical abdominal tenderness that localizes to the epigastric region.  No rebound tenderness, no tenderness over the left lower or right lower quadrants.  LCTAB, RRR, no murmur, no lower leg edema.  Oropharynx is clear with no lesions noted.  GU exam showed well-healing surgical site.  No signs of infection.  Stool occult blood testing was positive.  Exam is otherwise unremarkable.  Lab work was notable for positive fecal occult testing and elevated platelets at 478.  But were otherwise unremarkable.  No need for imaging at this time with hemoglobin remaining stable and no source of bleeding or gross blood noted on exam.  Will have her continue to follow-up with GI for further management at this time.  Provided first dose of prednisone  today.  Will have her take taper over the next 8 weeks, noting that she should call her GI doctor to confirm.  Also noted that she should call her GI doctor to see if she can be seen at an earlier appointment.  Case discussed with attending who agrees with plan.  Patient vital signs have remained stable throughout the course of patient's time in the ED. Low  suspicion for any other emergent pathology at this time. I believe this patient is safe to be discharged. Provided strict return to ER precautions. Patient expressed agreement and understanding of plan. All questions were answered.  Differential diagnoses prior to evaluation: The emergent differential diagnosis includes, but is not limited to, ulcerative colitis/IBD, anal fissure, hemorrhoids, infectious colitis, angiodysplasia, Crohn's disease, toxic megacolon, colonic perforation, diverticulitis, appendicitis. This is not an exhaustive differential.   Past Medical History / Co-morbidities / Social History: Ulcerative colitis, proctitis  Status post labioplasty  Additional history: Chart reviewed. Pertinent results include:   Recently did have surgery on 10/15/2023 for right complex repair of labia minora after dehiscence, noted to have been seen for her postop visit on 10/21/2023.  Reporting odor and looking different.  Exam appeared to have no evidence of separation at that time.  Was prescribed Diflucan  and use OTC yeast infection e.g. Monistat.  Noted to have an additional follow-up in 1 week at that time.  Lab Tests/Imaging studies: I personally interpreted labs/imaging and the pertinent results include:    CBC notes a elevated platelets of 478 but otherwise unremarkable CMP unremarkable Lipase unremarkable hCG qualitative negative Fecal occult blood testing positive Type and screen was done.  Medications: I ordered medication including prednisone .  I have reviewed the patients home medicines and have made adjustments as needed.  Critical Interventions: None  Social Determinants of Health: Notably has scheduled follow-up with GI in December, which for her to call to see if she was scheduled for an earlier appointment  Disposition: After consideration of the diagnostic results and the patients response to treatment, I feel that the patient would benefit from discharge and  treatment as above.   emergency department workup does not suggest an emergent condition requiring admission or immediate intervention beyond what has been performed at this time. The plan is: Prednisone  taper for 8 weeks, follow-up with GI, return to the ED for any new or worsening symptoms. The patient is safe for discharge and  has been instructed to return immediately for worsening symptoms, change in symptoms or any other concerns.   Final diagnoses:  Rectal bleeding    ED Discharge Orders          Ordered    predniSONE  (DELTASONE ) 5 MG tablet  Daily        10/23/23 2108               Najma Bozarth S, PA-C 10/23/23 2114    Charlyn Sora, MD 10/23/23 2329

## 2023-10-23 NOTE — ED Triage Notes (Signed)
 Pt reports history of UC with worsening bloody diarrhea for the past few months. Pt unable to be seen by GI until December Pt has also had labiaplasty revision on Friday and wants to be sure that everything is ok.

## 2023-10-23 NOTE — ED Notes (Signed)
 Documented IV access and blood draws for RN assigned to pt.

## 2023-10-23 NOTE — ED Notes (Signed)
 Pt alert, NAD, calm, interactive, scrolling on cell phone. Wants referral to other GI options.

## 2023-10-23 NOTE — Discharge Instructions (Addendum)
 You were seen today for rectal bleeding, likely to be ulcerative colitis flareup.  I am prescribing you prednisone  taper which you are to take for the next 8 weeks.  First 2 weeks you will take 40 mg daily. Third week take 35 mg Fourth week take 30 mg Fifth week take 25 mg Sixth take 20 mg Seventh week take 15 mg Eighth week take 10 mg  Please be sure to call your GI doctor to confirm that he would wish for you to take this medication and also to see if they can schedule you for a sooner appointment.  Please return to the ED if you been having new or worsening symptoms in the interim.

## 2023-10-28 DIAGNOSIS — N906 Unspecified hypertrophy of vulva: Secondary | ICD-10-CM | POA: Diagnosis not present

## 2023-11-08 ENCOUNTER — Telehealth: Admitting: Physician Assistant

## 2023-11-08 DIAGNOSIS — B3731 Acute candidiasis of vulva and vagina: Secondary | ICD-10-CM | POA: Diagnosis not present

## 2023-11-08 MED ORDER — FLUCONAZOLE 150 MG PO TABS: 150.0000 mg | ORAL_TABLET | Freq: Every day | ORAL | 0 refills | Status: DC

## 2023-11-08 NOTE — Progress Notes (Signed)
 I have spent 5 minutes in review of e-visit questionnaire, review and updating patient chart, medical decision making and response to patient.   Piedad Climes, PA-C

## 2023-11-08 NOTE — Progress Notes (Signed)

## 2023-11-18 DIAGNOSIS — N906 Unspecified hypertrophy of vulva: Secondary | ICD-10-CM | POA: Diagnosis not present

## 2023-11-26 ENCOUNTER — Telehealth: Admitting: Nurse Practitioner

## 2023-11-26 DIAGNOSIS — L709 Acne, unspecified: Secondary | ICD-10-CM | POA: Diagnosis not present

## 2023-11-26 MED ORDER — DOXYCYCLINE HYCLATE 100 MG PO TABS: ORAL_TABLET | ORAL | 0 refills | Status: DC

## 2023-11-26 NOTE — Progress Notes (Signed)
 E-Visit for Acne   We are sorry that you are experiencing this issue.  Here is how we plan to help!  Based on what you shared with me it looks like you have cystic acne.  Acne is a disorder of the hair follicles and oil glands (sebaceous glands). The sebaceous glands secrete oils to keep the skin moist.  When the glands get clogged, it can lead to pimples or cysts.  These cysts may become infected and leave scars. Acne is very common and normally occurs at puberty.  Acne is also inherited.  Your personal care plan consists of the following recommendations:  I recommend that you use a daily cleanser  You might try an over the counter cleanser that has benzoyl peroxide.  I recommend that you start with a product that has 2.5% benzoyl peroxide.  Stronger concentrations have not been shown to be more effective.  It is important to note that the oral antibiotics that are used to treat acne may decrease the efficacy of your birth control. If you are sexually active you need to use a backup method (like condoms) to prevent pregnancy.   I have also prescribed one of the following additional therapies:  Meds ordered this encounter  Medications   doxycycline  (VIBRA -TABS) 100 MG tablet    Sig: Take one tablet twice daily for 10 days, then decrease to once daily for 20 days. Take with food.    Dispense:  40 tablet    Refill:  0     If excessive dryness or peeling occurs, reduce dose frequency or concentration of the topical scrubs.  If excessive stinging or burning occurs, remove the topical gel with mild soap and water and resume at a lower dose the next day.  Remember oral antibiotics and topical acne treatments may increase your sensitivity to the sun!  HOME CARE: Do not squeeze pimples because that can often lead to infections, worse acne, and scars. Use a moisturizer that contains retinoid or fruit acids that may inhibit the development of new acne lesions. Although there is not a clear link  that foods can cause acne, doctors do believe that too many sweets predispose you to skin problems.  GET HELP RIGHT AWAY IF: If your acne gets worse or is not better within 10 days. If you become depressed. If you become pregnant, discontinue medications and call your OB/GYN.  MAKE SURE YOU: Understand these instructions. Will watch your condition. Will get help right away if you are not doing well or get worse.  Thank you for choosing an e-visit.  Your e-visit answers were reviewed by a board certified advanced clinical practitioner to complete your personal care plan. Depending upon the condition, your plan could have included both over the counter or prescription medications.  Please review your pharmacy choice. Make sure the pharmacy is open so you can pick up prescription now. If there is a problem, you may contact your provider through [employer] and have the prescription routed to another pharmacy.  Your safety is important to us . If you have drug allergies check your prescription carefully.   For the next 24 hours you can use MyChart to ask questions about today's visit, request a non-urgent call back, or ask for a work or school excuse. You will get an email in the next two days asking about your experience. I hope that your e-visit has been valuable and will speed your recovery.   I spent approximately 5 minutes reviewing the patient's history,  current symptoms and coordinating their care today.

## 2023-12-26 ENCOUNTER — Other Ambulatory Visit: Payer: Self-pay | Admitting: Nurse Practitioner

## 2023-12-26 DIAGNOSIS — L709 Acne, unspecified: Secondary | ICD-10-CM

## 2023-12-31 ENCOUNTER — Other Ambulatory Visit (HOSPITAL_COMMUNITY)
Admission: RE | Admit: 2023-12-31 | Discharge: 2023-12-31 | Disposition: A | Discharge status: Home / Self Care | Source: Ambulatory Visit | Attending: Obstetrics and Gynecology | Admitting: Obstetrics and Gynecology

## 2023-12-31 ENCOUNTER — Encounter: Payer: Self-pay | Admitting: Obstetrics and Gynecology

## 2023-12-31 ENCOUNTER — Ambulatory Visit: Admitting: Obstetrics and Gynecology

## 2023-12-31 VITALS — BP 101/69 | HR 76 | Ht 62.0 in | Wt 158.0 lb

## 2023-12-31 DIAGNOSIS — Z113 Encounter for screening for infections with a predominantly sexual mode of transmission: Secondary | ICD-10-CM

## 2023-12-31 DIAGNOSIS — N760 Acute vaginitis: Secondary | ICD-10-CM

## 2023-12-31 DIAGNOSIS — N898 Other specified noninflammatory disorders of vagina: Secondary | ICD-10-CM

## 2023-12-31 DIAGNOSIS — N926 Irregular menstruation, unspecified: Secondary | ICD-10-CM

## 2023-12-31 NOTE — Progress Notes (Addendum)
 23 y.o. GYN presents for vaginal irritation, STD screening and HCG.  Pt nolonger takes OCP.  Last PAP 06/16/21  NILM

## 2023-12-31 NOTE — Progress Notes (Signed)
 GYNECOLOGY VISIT  Patient name: Robin I Alred MRN [id number]  Date of birth: 2000/04/30 Chief Complaint:   Gynecologic Exam   History:  Discussed the use of AI scribe software for clinical note transcription with the patient, who gave verbal consent to proceed.  History of Present Illness Robin Garrison is a 23 year old female who presents for STD and pregnancy testing.  She is experiencing vaginal discharge and prefers a blood test for pregnancy due to a previous experience where a urine test was negative, but she was later found to be pregnant.  Her last menstrual period began on September 1st and was shorter than usual, lasting two days instead of the typical four. She has been off birth control for three months following surgery, during which she was unable to maintain her medication regimen due to pain and immobility.  She reports experiencing vaginal dryness, which she noticed after starting birth control. She reports that she was previously switched to a lower estrogen pill by Dr. Erik because she was experiencing what she described as 'vaginal tinnitus,' and she still has a supply of these pills.  She is sexually active and not currently using birth control.    The following portions of the patient's history were reviewed and updated as appropriate: allergies, current medications, past family history, past medical history, past social history, past surgical history and problem list.   Health Maintenance:   Last pap     Component Value Date/Time   DIAGPAP  06/16/2021 1052    - Negative for intraepithelial lesion or malignancy (NILM)   DIAGPAP  05/03/2020 0904    - Negative for intraepithelial lesion or malignancy (NILM)   ADEQPAP  06/16/2021 1052    Satisfactory for evaluation; transformation zone component PRESENT.   ADEQPAP  05/03/2020 0904    Satisfactory for evaluation; transformation zone component PRESENT.    Health Maintenance  Topic Date Due   Colon  Cancer Screening  09/13/2022   Flu Shot  11/19/2023   COVID-19 Vaccine (1 - 2024-25 season) Never done   Pap Smear  06/16/2024   DTaP/Tdap/Td vaccine (10 - Td or Tdap) 11/06/2028   Hepatitis B Vaccine  Completed   HPV Vaccine  Completed   Hepatitis C Screening  Completed   HIV Screening  Completed   Pneumococcal Vaccine  Aged Out   Meningitis B Vaccine  Aged Out      Review of Systems:  Pertinent items are noted in HPI. Comprehensive review of systems was otherwise negative.   Objective:  Physical Exam BP 101/69   Pulse 76   Ht 5' 2 (1.575 m)   Wt 158 lb (71.7 kg)   LMP 12/20/2023 (Exact Date)   BMI 28.90 kg/m    Physical Exam   Labs and Imaging No results found.     Assessment & Plan:  Assessment and Plan Assessment & Plan Vaginal discharge Experiencing discharge with unspecified etiology. STI screening planned to rule out infectious causes. - Order STI screening tests.  Irregular menstruation and pregnancy concern Recent atypical menstrual period. Off birth control for three months post-surgery. Concerned about pregnancy due to previous false negative urine test. Discussed hormonal levels and potential causes of vaginal dryness. - Order blood pregnancy test. - Order A1c and thyroid  function tests.  Vaginal dryness Experiencing dryness, possibly related to hormonal changes. Other potential causes include thyroid  issues and diabetes. - Evaluate thyroid  function and A1c levels.  .  Latanga Nedrow, MD Minimally Invasive Gynecologic Surgery  Center for [employer], [facility]

## 2024-01-01 LAB — TSH RFX ON ABNORMAL TO FREE T4: TSH: 1.89 u[IU]/mL (ref 0.450–4.500)

## 2024-01-03 ENCOUNTER — Ambulatory Visit: Payer: Self-pay | Admitting: Obstetrics and Gynecology

## 2024-01-03 LAB — CERVICOVAGINAL ANCILLARY ONLY
Bacterial Vaginitis (gardnerella): NEGATIVE
Candida Glabrata: NEGATIVE
Candida Vaginitis: NEGATIVE
Chlamydia: NEGATIVE
Comment: NEGATIVE
Comment: NEGATIVE
Comment: NEGATIVE
Comment: NEGATIVE
Comment: NEGATIVE
Comment: NORMAL
Neisseria Gonorrhea: NEGATIVE
Trichomonas: NEGATIVE

## 2024-01-14 DIAGNOSIS — K5190 Ulcerative colitis, unspecified, without complications: Secondary | ICD-10-CM | POA: Diagnosis not present

## 2024-01-24 ENCOUNTER — Ambulatory Visit

## 2024-01-24 ENCOUNTER — Ambulatory Visit (INDEPENDENT_AMBULATORY_CARE_PROVIDER_SITE_OTHER)

## 2024-01-24 ENCOUNTER — Other Ambulatory Visit (HOSPITAL_COMMUNITY)
Admission: RE | Admit: 2024-01-24 | Discharge: 2024-01-24 | Disposition: A | Discharge status: Home / Self Care | Source: Ambulatory Visit | Attending: Obstetrics | Admitting: Obstetrics

## 2024-01-24 VITALS — BP 113/77 | HR 79 | Wt 163.5 lb

## 2024-01-24 DIAGNOSIS — Z113 Encounter for screening for infections with a predominantly sexual mode of transmission: Secondary | ICD-10-CM | POA: Diagnosis not present

## 2024-01-24 DIAGNOSIS — Z202 Contact with and (suspected) exposure to infections with a predominantly sexual mode of transmission: Secondary | ICD-10-CM | POA: Insufficient documentation

## 2024-01-24 NOTE — Progress Notes (Signed)
 SUBJECTIVE:  23 y.o. female who desires a STI screen. Denies abnormal vaginal discharge, bleeding or significant pelvic pain. No UTI symptoms. Possible recent exposure to STI.  Patient's last menstrual period was 12/20/2023 (exact date).  OBJECTIVE:  She appears well.   ASSESSMENT:  STI Screen   PLAN:  Pt offered STI blood screening-not indicated GC, chlamydia, and trichomonas probe sent to lab.  Treatment: To be determined once lab results are received.  Pt follow up as needed.

## 2024-01-25 LAB — CERVICOVAGINAL ANCILLARY ONLY
Bacterial Vaginitis (gardnerella): NEGATIVE
Candida Glabrata: NEGATIVE
Candida Vaginitis: NEGATIVE
Chlamydia: NEGATIVE
Comment: NEGATIVE
Comment: NEGATIVE
Comment: NEGATIVE
Comment: NEGATIVE
Comment: NEGATIVE
Comment: NORMAL
Neisseria Gonorrhea: NEGATIVE
Trichomonas: NEGATIVE

## 2024-02-22 ENCOUNTER — Telehealth

## 2024-02-22 DIAGNOSIS — L700 Acne vulgaris: Secondary | ICD-10-CM

## 2024-02-23 NOTE — Progress Notes (Signed)
 Because of requesting Accutane, I feel your condition warrants further evaluation and I recommend that you be seen for a face to face visit.  Please contact your primary care physician practice to be seen. Many offices offer virtual options to be seen via video if you are not comfortable going in person to a medical facility at this time.  This medication has to come from an approved prescriber only (most often dermatologist) and requires frequent monitoring and pregnancy test as it can be toxic to a fetus.   NOTE: You will NOT be charged for this eVisit.  If you do not have a PCP, [facility] offers a free physician referral service available at [phone]. Our trained staff has the experience, knowledge and resources to put you in touch with a physician who is right for you.    If you are having a true medical emergency please call 911.   Your e-visit answers were reviewed by a board certified advanced clinical practitioner to complete your personal care plan.  Thank you for using e-Visits.

## 2024-03-02 ENCOUNTER — Other Ambulatory Visit: Payer: Self-pay

## 2024-03-02 ENCOUNTER — Ambulatory Visit (INDEPENDENT_AMBULATORY_CARE_PROVIDER_SITE_OTHER): Admitting: Obstetrics and Gynecology

## 2024-03-02 ENCOUNTER — Encounter: Payer: Self-pay | Admitting: Obstetrics and Gynecology

## 2024-03-02 VITALS — BP 103/70 | HR 86 | Ht 62.0 in | Wt 163.4 lb

## 2024-03-02 DIAGNOSIS — Z3189 Encounter for other procreative management: Secondary | ICD-10-CM

## 2024-03-02 NOTE — Progress Notes (Signed)
 Pt concerned for infertility issues. Has stopped OCP in May. Resumed regular periods following this.  TTC w partner for 3 months. With no pregnancy. She had no issues conceiving with previous pregnancy. Would like to discuss Accutane as well.

## 2024-03-02 NOTE — Progress Notes (Signed)
  CC: fertility discussion Subjective:    Patient ID: Robin Garrison, female    DOB: 11-19-2000, 23 y.o.   MRN: [id number]  HPI 23 yo G1P1 seen for discussion of fertility.  Pt has regular menses.  She and partner have been trying for pregnancy x 3 months.  Pt was concerned regarding PCOS, but is unlikely due to regular menses.  Pt also interested in accutane, patient informed if she uses that medication, she will need to be on birth control due to possibility of severe birth defects if pregnancy occurs.  Pt notes hx of chlamydia x 2.   Review of Systems     Objective:   Physical Exam Vitals:   03/02/24 0948  BP: 103/70  Pulse: 86         Assessment & Plan:   1. Encounter for fertility planning (Primary) Pt will need AE/pap in 4 months, can reevaluate then. Discussed starting prenatal vitamins now and pursuing timed intercourse until next visit. If still not pregnant, will need to consider possible HSG and semen analysis in the spring.  I spent 20 minutes dedicated to the care of this patient including previsit review of records, face to face time with the patient discussing physiology, treatment options/plan and post visit testing.     Jerilynn DELENA Buddle, MD Faculty Attending, Center for [facility]

## 2024-03-06 DIAGNOSIS — K5289 Other specified noninfective gastroenteritis and colitis: Secondary | ICD-10-CM | POA: Diagnosis not present

## 2024-03-06 DIAGNOSIS — K921 Melena: Secondary | ICD-10-CM | POA: Diagnosis not present

## 2024-03-06 DIAGNOSIS — K6389 Other specified diseases of intestine: Secondary | ICD-10-CM | POA: Diagnosis not present

## 2024-03-06 DIAGNOSIS — K626 Ulcer of anus and rectum: Secondary | ICD-10-CM | POA: Diagnosis not present

## 2024-03-06 DIAGNOSIS — K5100 Ulcerative (chronic) pancolitis without complications: Secondary | ICD-10-CM | POA: Diagnosis not present

## 2024-03-06 DIAGNOSIS — K6289 Other specified diseases of anus and rectum: Secondary | ICD-10-CM | POA: Diagnosis not present

## 2024-03-08 DIAGNOSIS — M9902 Segmental and somatic dysfunction of thoracic region: Secondary | ICD-10-CM | POA: Diagnosis not present

## 2024-03-08 DIAGNOSIS — R519 Headache, unspecified: Secondary | ICD-10-CM | POA: Diagnosis not present

## 2024-03-08 DIAGNOSIS — M9901 Segmental and somatic dysfunction of cervical region: Secondary | ICD-10-CM | POA: Diagnosis not present

## 2024-03-08 DIAGNOSIS — M542 Cervicalgia: Secondary | ICD-10-CM | POA: Diagnosis not present

## 2024-03-09 ENCOUNTER — Telehealth

## 2024-03-09 DIAGNOSIS — N926 Irregular menstruation, unspecified: Secondary | ICD-10-CM

## 2024-03-09 DIAGNOSIS — N898 Other specified noninflammatory disorders of vagina: Secondary | ICD-10-CM

## 2024-03-09 DIAGNOSIS — R6882 Decreased libido: Secondary | ICD-10-CM

## 2024-03-10 ENCOUNTER — Telehealth: Admitting: Family Medicine

## 2024-03-10 DIAGNOSIS — M9901 Segmental and somatic dysfunction of cervical region: Secondary | ICD-10-CM | POA: Diagnosis not present

## 2024-03-10 DIAGNOSIS — R519 Headache, unspecified: Secondary | ICD-10-CM | POA: Diagnosis not present

## 2024-03-10 DIAGNOSIS — M542 Cervicalgia: Secondary | ICD-10-CM | POA: Diagnosis not present

## 2024-03-10 DIAGNOSIS — M9902 Segmental and somatic dysfunction of thoracic region: Secondary | ICD-10-CM | POA: Diagnosis not present

## 2024-03-10 NOTE — Progress Notes (Signed)
 Because of the issues you are having,  I feel your condition warrants further evaluation and I recommend that you be seen in a face to face visit with your gynecologist or at one of our [facility] clinics.   NOTE: There will be NO CHARGE for this eVisit   If you are having a true medical emergency please call 911.    *Center for [facility] at [facility]             [address], [place], [place] [address] [phone] (*Take patients with no insurance)  *Center for [employer] at [facility] [facility] [address] [place], [place],  [place]  [address] [phone] (*Take patients with no insurance)  Center for [employer] at [employer]                                                             [address], [place], [place], [address] [phone]  Center for [facility] at [facility] [address], [address], [place], [place], [address] [phone]  Center for [facility] at [facility] [address], [address], [place], [place], [address] [phone]  Center for [facility] at [facility]                                 [address] [place], [place], [place], [address] [phone]  Center for [facility] at [facility]                                    [address], [place], [place], [address] [phone]  Center for [facility] at [facility] [address], [place], [place], [address]                              [facility] [place] [address], [address], [place], [place], [address] [phone]  Your MyChart E-visit questionnaire answers were reviewed by a board certified advanced clinical practitioner to complete your personal care plan based on your specific symptoms.  Thank you for using e-Visits.

## 2024-03-10 NOTE — Progress Notes (Signed)
 Pt previously notified that we do not provided hormone therapy-[facility]

## 2024-03-13 DIAGNOSIS — K5190 Ulcerative colitis, unspecified, without complications: Secondary | ICD-10-CM | POA: Diagnosis not present

## 2024-03-20 ENCOUNTER — Encounter: Payer: Self-pay | Admitting: Obstetrics and Gynecology

## 2024-03-20 ENCOUNTER — Ambulatory Visit: Admitting: Obstetrics and Gynecology

## 2024-03-20 VITALS — BP 109/74 | HR 76 | Ht 62.0 in | Wt 166.0 lb

## 2024-03-20 DIAGNOSIS — N898 Other specified noninflammatory disorders of vagina: Secondary | ICD-10-CM

## 2024-03-20 MED ORDER — ESTRADIOL 0.01 % VA CREA: TOPICAL_CREAM | VAGINAL | 2 refills | Status: DC

## 2024-03-20 MED ORDER — ESTRADIOL 0.01 % VA CREA: TOPICAL_CREAM | VAGINAL | 2 refills | Status: AC

## 2024-03-20 NOTE — Progress Notes (Signed)
 Pt c/o vaginal dryness for years. She has tried several interventions with not success. She wants to try estrace  after doing her own research.   Pt also reports irregular periods.

## 2024-03-20 NOTE — Progress Notes (Signed)
   GYNECOLOGY PROGRESS NOTE  History:  23 y.o. G1P1001 presents to [facility]. She reports continued vaginal dryness. Has had this ongoing for years. Has tried different forms of birth control and has even gone off of birth control and continues to have vaginal dryness, day to day and with intercourse. She has tried lubricants, creams, she has tried coconut oil which caused irritation. She has tried Replens with no success.  Hasn't been on birth control since May or June, plan to start another method, wants to come back to discuss  The following portions of the patient's history were reviewed and updated as appropriate: allergies, current medications, past family history, past medical history, past social history, past surgical history and problem list. Last pap smear on 06/16/21 was normal  Health Maintenance Due  Topic Date Due   Colonoscopy  09/13/2022   Influenza Vaccine  11/19/2023   COVID-19 Vaccine (1 - 2025-26 season) Never done     Review of Systems:  Pertinent items are noted in HPI.   Objective:  Physical Exam Blood pressure 109/74, pulse 76, height 5' 2 (1.575 m), weight 166 lb (75.3 kg), last menstrual period 03/02/2024. VS reviewed, nursing note reviewed,  Constitutional: well developed, well nourished, no distress HEENT: normocephalic  Pulm/chest wall: normal effort Breast Exam: deferred Neuro: alert and oriented  Psych: affect normal Pelvic exam: deferred  Assessment & Plan:  1. Vaginal dryness (Primary) Has tried Replens lubricants, oils, etc with no relief. Strongly desires trial of vaginal estrogen. She has been seen for this multiple occurrences. She has been screened for and treated for vaginitis and STI Discussed use, no current contraindication  Wants to come back for follow up and birth control consult  - estradiol  (ESTRACE ) 0.01 % CREA vaginal cream; Apply 0.5g  per vagina every night for 2 weeks, then apply 0.5 once or twice a week as needed   Dispense: 30 g; Refill: 2 Future Appointments  Date Time Provider Department Center  05/04/2024  9:35 AM Erik Kieth BROCKS, MD [facility] None     Nidia Daring, FNP

## 2024-03-24 DIAGNOSIS — M9901 Segmental and somatic dysfunction of cervical region: Secondary | ICD-10-CM | POA: Diagnosis not present

## 2024-03-24 DIAGNOSIS — M9902 Segmental and somatic dysfunction of thoracic region: Secondary | ICD-10-CM | POA: Diagnosis not present

## 2024-03-24 DIAGNOSIS — R519 Headache, unspecified: Secondary | ICD-10-CM | POA: Diagnosis not present

## 2024-03-24 DIAGNOSIS — M542 Cervicalgia: Secondary | ICD-10-CM | POA: Diagnosis not present

## 2024-03-28 ENCOUNTER — Ambulatory Visit: Admitting: Obstetrics

## 2024-03-30 DIAGNOSIS — N9089 Other specified noninflammatory disorders of vulva and perineum: Secondary | ICD-10-CM | POA: Diagnosis not present

## 2024-04-07 DIAGNOSIS — R519 Headache, unspecified: Secondary | ICD-10-CM | POA: Diagnosis not present

## 2024-04-07 DIAGNOSIS — M542 Cervicalgia: Secondary | ICD-10-CM | POA: Diagnosis not present

## 2024-04-07 DIAGNOSIS — M9901 Segmental and somatic dysfunction of cervical region: Secondary | ICD-10-CM | POA: Diagnosis not present

## 2024-04-07 DIAGNOSIS — M9902 Segmental and somatic dysfunction of thoracic region: Secondary | ICD-10-CM | POA: Diagnosis not present

## 2024-05-04 ENCOUNTER — Ambulatory Visit: Admitting: Obstetrics and Gynecology

## 2024-05-23 ENCOUNTER — Ambulatory Visit: Admitting: Obstetrics and Gynecology
